# Patient Record
Sex: Female | Born: 1965 | Race: Black or African American | Hispanic: No | Marital: Single | Smoking: Never smoker
Health system: Southern US, Community
[De-identification: ages and names within clinical notes are randomized; demographics above are authoritative.]

## PROBLEM LIST (undated history)

## (undated) DIAGNOSIS — N1831 Chronic kidney disease, stage 3a: Secondary | ICD-10-CM

## (undated) DIAGNOSIS — E785 Hyperlipidemia, unspecified: Secondary | ICD-10-CM

## (undated) DIAGNOSIS — G4733 Obstructive sleep apnea (adult) (pediatric): Secondary | ICD-10-CM

## (undated) DIAGNOSIS — E1129 Type 2 diabetes mellitus with other diabetic kidney complication: Secondary | ICD-10-CM

## (undated) DIAGNOSIS — I10 Essential (primary) hypertension: Secondary | ICD-10-CM

## (undated) HISTORY — PX: TUBAL LIGATION: SHX77

---

## 2004-05-27 ENCOUNTER — Emergency Department: Payer: Self-pay | Admitting: Emergency Medicine

## 2010-11-17 ENCOUNTER — Emergency Department: Payer: Self-pay | Admitting: *Deleted

## 2012-12-18 ENCOUNTER — Emergency Department: Payer: Self-pay | Admitting: Emergency Medicine

## 2012-12-19 LAB — COMPREHENSIVE METABOLIC PANEL
Albumin: 3.8 g/dL (ref 3.4–5.0)
BUN: 12 mg/dL (ref 7–18)
Calcium, Total: 9.5 mg/dL (ref 8.5–10.1)
Chloride: 104 mmol/L (ref 98–107)
EGFR (African American): >60
Potassium: 3.8 mmol/L (ref 3.5–5.1)
SGOT(AST): 14 U/L — ABNORMAL LOW (ref 15–37)
SGPT (ALT): 24 U/L (ref 12–78)
Sodium: 137 mmol/L (ref 136–145)
Total Protein: 8.6 g/dL — ABNORMAL HIGH (ref 6.4–8.2)

## 2012-12-19 LAB — URINALYSIS, COMPLETE
Bacteria: NONE SEEN
Bilirubin,UR: NEGATIVE
Blood: NEGATIVE
Glucose,UR: NEGATIVE mg/dL (ref 0–75)
Ketone: NEGATIVE
Nitrite: NEGATIVE
Ph: 7 (ref 4.5–8.0)
Protein: NEGATIVE
RBC,UR: 1 /HPF (ref 0–5)
Specific Gravity: 1.01 (ref 1.003–1.030)
Squamous Epithelial: 2
WBC UR: 7 /HPF (ref 0–5)

## 2012-12-19 LAB — CBC
HGB: 13.9 g/dL (ref 12.0–16.0)
MCH: 27.6 pg (ref 26.0–34.0)
MCV: 81 fL (ref 80–100)
Platelet: 209 10*3/uL (ref 150–440)
RDW: 14.7 % — ABNORMAL HIGH (ref 11.5–14.5)
WBC: 6 10*3/uL (ref 3.6–11.0)

## 2012-12-19 LAB — TSH: Thyroid Stimulating Horm: 5.15 u[IU]/mL — ABNORMAL HIGH

## 2012-12-19 LAB — TROPONIN I: Troponin-I: <0.02 ng/mL

## 2014-02-19 ENCOUNTER — Emergency Department: Payer: Self-pay | Admitting: Emergency Medicine

## 2014-06-04 DIAGNOSIS — J4530 Mild persistent asthma, uncomplicated: Secondary | ICD-10-CM | POA: Insufficient documentation

## 2015-07-04 ENCOUNTER — Other Ambulatory Visit: Payer: Self-pay | Admitting: Orthopedic Surgery

## 2015-07-04 DIAGNOSIS — S46001D Unspecified injury of muscle(s) and tendon(s) of the rotator cuff of right shoulder, subsequent encounter: Secondary | ICD-10-CM

## 2015-07-23 ENCOUNTER — Ambulatory Visit: Payer: Self-pay

## 2016-08-10 ENCOUNTER — Other Ambulatory Visit: Payer: Self-pay | Admitting: General Practice

## 2016-08-10 ENCOUNTER — Ambulatory Visit
Admission: RE | Admit: 2016-08-10 | Discharge: 2016-08-10 | Disposition: A | Discharge status: Home / Self Care | Payer: Disability Insurance | Source: Ambulatory Visit | Attending: General Practice | Admitting: General Practice

## 2016-08-10 DIAGNOSIS — M170 Bilateral primary osteoarthritis of knee: Secondary | ICD-10-CM

## 2018-11-27 IMAGING — CR DG KNEE 1-2V*L*
1 series · 2 of 2 positions shown · non-contrast
Comparison: None.

CLINICAL DATA: Knee pain

EXAM:
LEFT KNEE - 2 VIEW

[Series 1: dg knee 1-2 views left · 0.14mm/px · 2 of 2 slices shown]
[im 1/2]
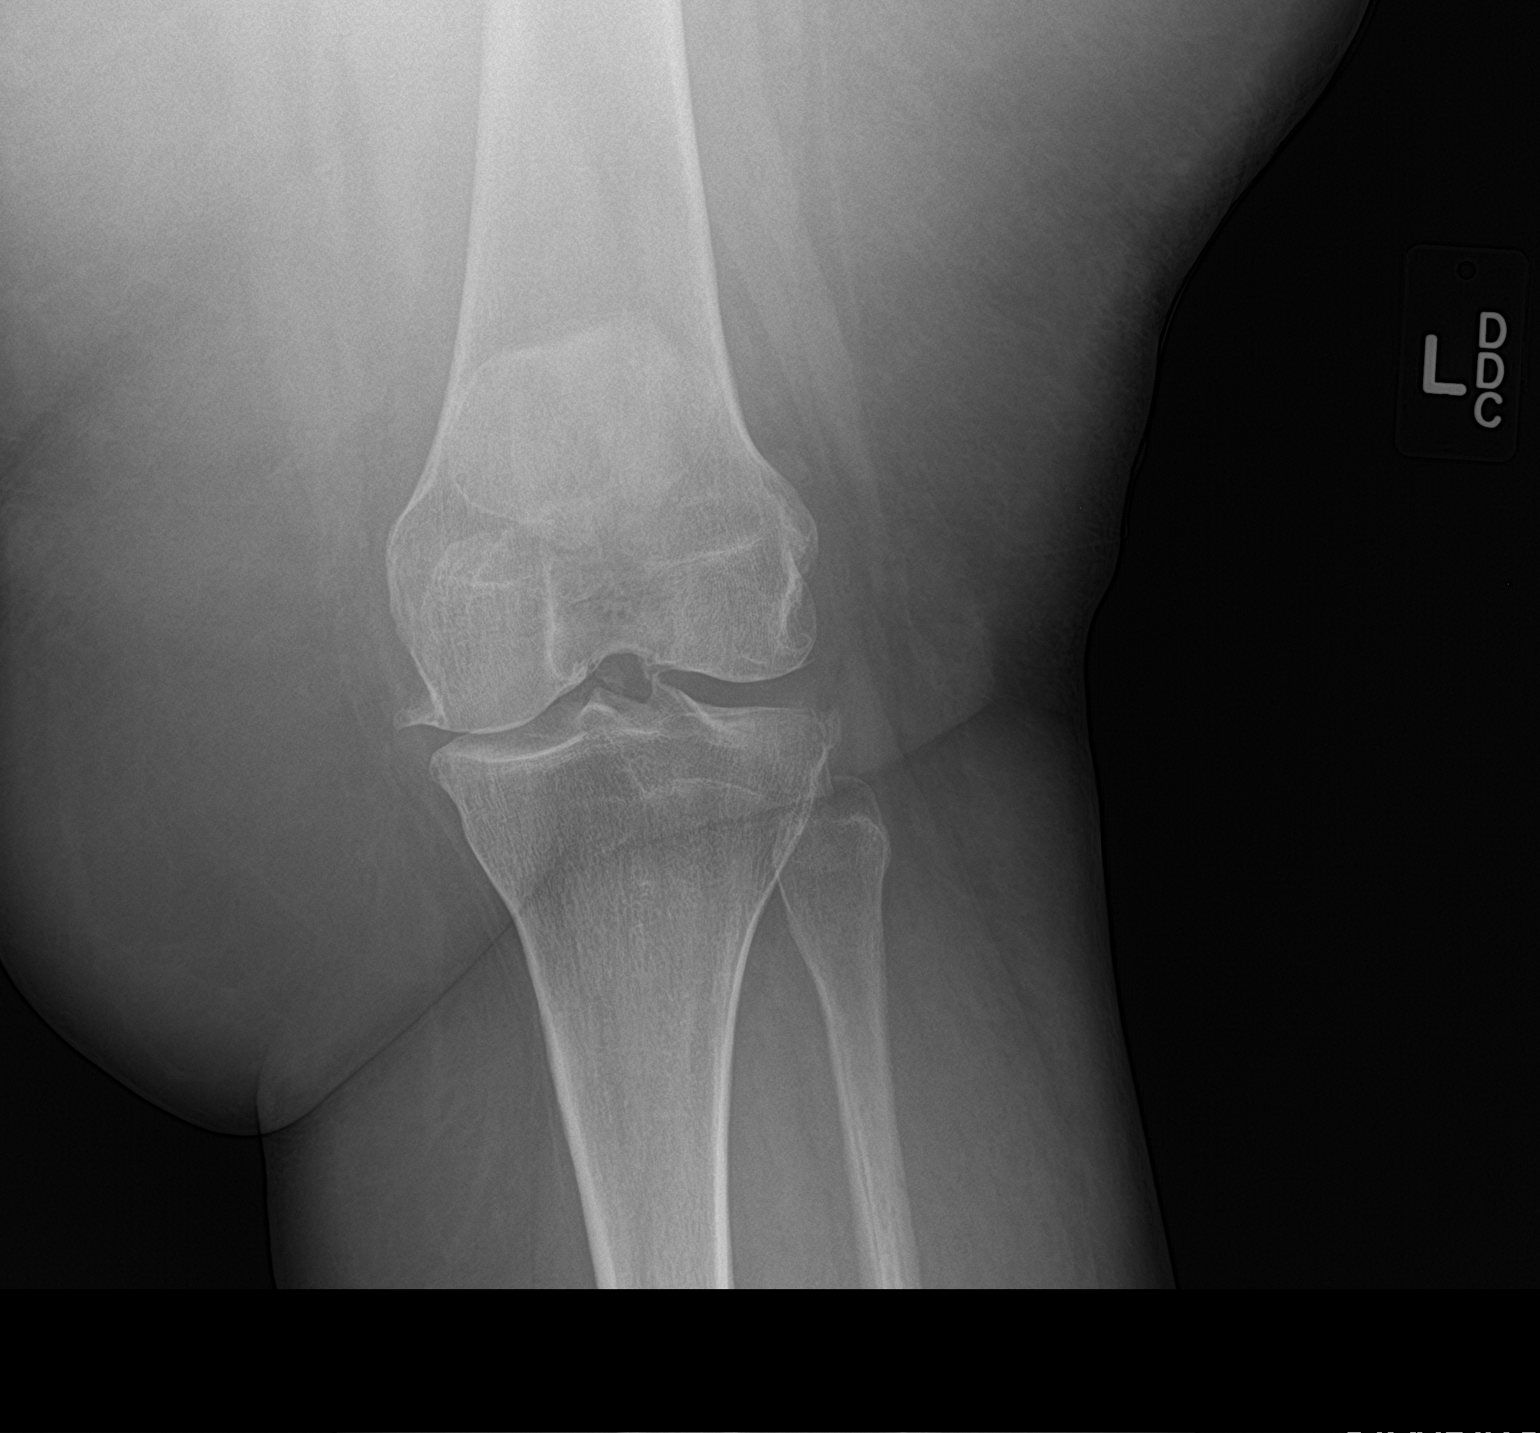
[im 2/2]
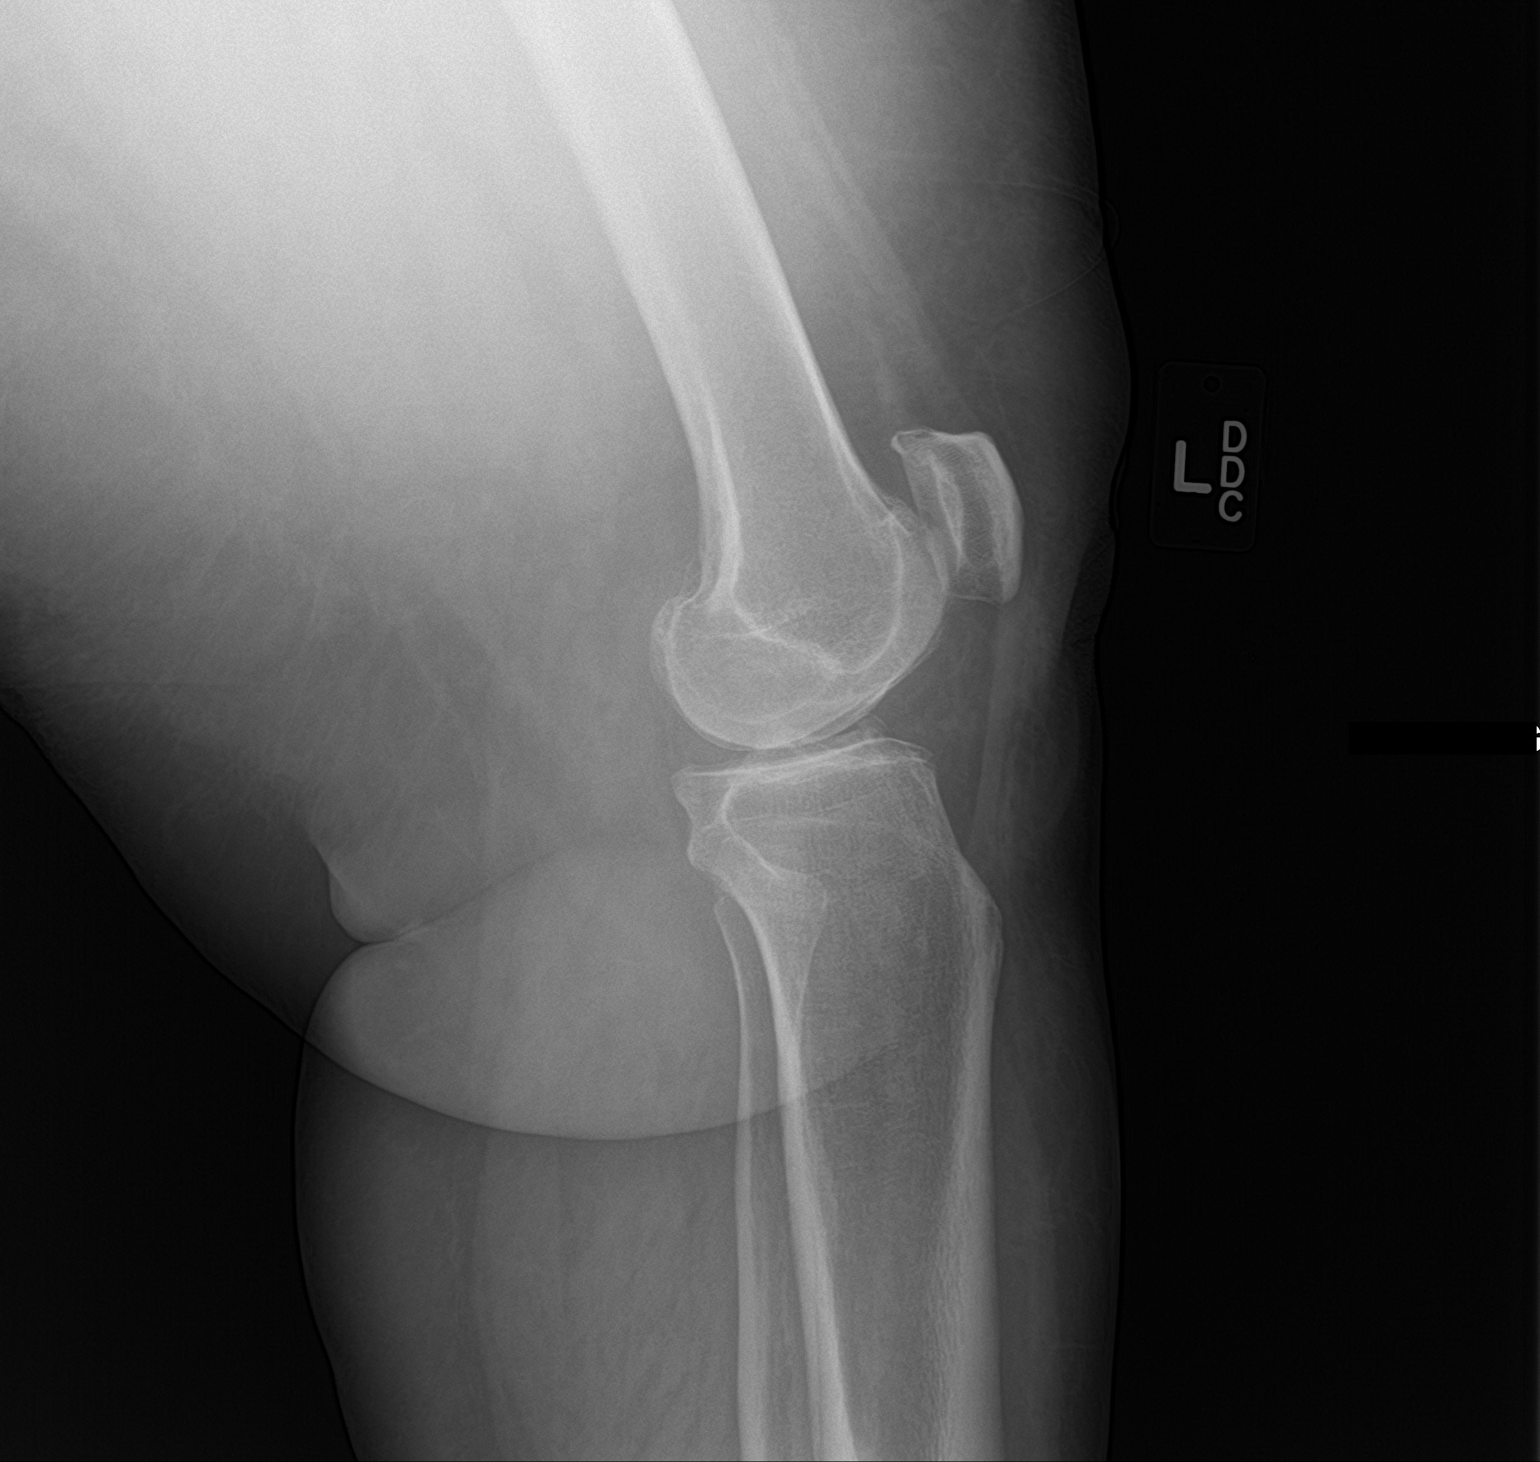

[2 of 2 positions shown; findings below may reference images not displayed]

FINDINGS: Degenerative changes are noted in all 3 joint compartments. No acute
fracture or dislocation is noted. No joint effusion is seen. No soft
tissue abnormality is noted.
IMPRESSION: Degenerative change without acute abnormality.

## 2019-09-12 ENCOUNTER — Other Ambulatory Visit: Payer: Self-pay | Admitting: Family Medicine

## 2019-09-12 DIAGNOSIS — M5416 Radiculopathy, lumbar region: Secondary | ICD-10-CM

## 2019-09-12 DIAGNOSIS — G8929 Other chronic pain: Secondary | ICD-10-CM

## 2019-10-17 ENCOUNTER — Ambulatory Visit: Payer: Disability Insurance | Admitting: Dietician

## 2020-03-04 ENCOUNTER — Telehealth: Payer: Self-pay | Admitting: Medical

## 2020-04-10 ENCOUNTER — Other Ambulatory Visit: Payer: Self-pay | Admitting: Family Medicine

## 2020-04-10 DIAGNOSIS — R748 Abnormal levels of other serum enzymes: Secondary | ICD-10-CM

## 2020-04-19 ENCOUNTER — Other Ambulatory Visit: Payer: Self-pay

## 2020-04-19 ENCOUNTER — Ambulatory Visit
Admission: RE | Admit: 2020-04-19 | Discharge: 2020-04-19 | Disposition: A | Discharge status: Home / Self Care | Payer: 59 | Source: Ambulatory Visit | Attending: Family Medicine | Admitting: Family Medicine

## 2020-04-19 DIAGNOSIS — R748 Abnormal levels of other serum enzymes: Secondary | ICD-10-CM | POA: Diagnosis present

## 2021-03-05 ENCOUNTER — Encounter: Payer: Self-pay | Admitting: Nurse Practitioner

## 2021-03-05 ENCOUNTER — Other Ambulatory Visit: Payer: Self-pay | Admitting: Nurse Practitioner

## 2021-03-05 ENCOUNTER — Other Ambulatory Visit: Payer: Self-pay

## 2021-03-05 VITALS — BP 119/71 | HR 83 | Temp 97.9°F | Resp 16

## 2021-03-05 DIAGNOSIS — J0110 Acute frontal sinusitis, unspecified: Secondary | ICD-10-CM

## 2021-03-05 MED ORDER — DOXYCYCLINE HYCLATE 100 MG PO TABS: 100.0000 mg | ORAL_TABLET | Freq: Two times a day (BID) | ORAL | 0 refills | Status: DC

## 2021-03-05 NOTE — Progress Notes (Signed)
Patient chart created in error.

## 2021-03-10 ENCOUNTER — Other Ambulatory Visit: Payer: Self-pay

## 2021-03-10 ENCOUNTER — Ambulatory Visit: Payer: 59 | Admitting: Medical

## 2022-06-23 ENCOUNTER — Other Ambulatory Visit: Payer: Self-pay

## 2022-06-23 ENCOUNTER — Encounter: Payer: Self-pay | Admitting: Internal Medicine

## 2022-06-23 ENCOUNTER — Inpatient Hospital Stay: Payer: 59

## 2022-06-23 ENCOUNTER — Inpatient Hospital Stay
Admission: EM | Admit: 2022-06-23 | Discharge: 2022-06-29 | DRG: 981 | Disposition: A | Discharge status: Home Health | Payer: 59 | Attending: Obstetrics and Gynecology | Admitting: Obstetrics and Gynecology

## 2022-06-23 DIAGNOSIS — T50995A Adverse effect of other drugs, medicaments and biological substances, initial encounter: Secondary | ICD-10-CM | POA: Diagnosis present

## 2022-06-23 DIAGNOSIS — Y92009 Unspecified place in unspecified non-institutional (private) residence as the place of occurrence of the external cause: Secondary | ICD-10-CM

## 2022-06-23 DIAGNOSIS — E1129 Type 2 diabetes mellitus with other diabetic kidney complication: Secondary | ICD-10-CM | POA: Diagnosis present

## 2022-06-23 DIAGNOSIS — Z791 Long term (current) use of non-steroidal anti-inflammatories (NSAID): Secondary | ICD-10-CM

## 2022-06-23 DIAGNOSIS — K853 Drug induced acute pancreatitis without necrosis or infection: Secondary | ICD-10-CM | POA: Diagnosis present

## 2022-06-23 DIAGNOSIS — J45909 Unspecified asthma, uncomplicated: Secondary | ICD-10-CM | POA: Diagnosis present

## 2022-06-23 DIAGNOSIS — R7989 Other specified abnormal findings of blood chemistry: Secondary | ICD-10-CM | POA: Diagnosis present

## 2022-06-23 DIAGNOSIS — N189 Chronic kidney disease, unspecified: Secondary | ICD-10-CM | POA: Diagnosis not present

## 2022-06-23 DIAGNOSIS — I428 Other cardiomyopathies: Secondary | ICD-10-CM | POA: Diagnosis not present

## 2022-06-23 DIAGNOSIS — R339 Retention of urine, unspecified: Secondary | ICD-10-CM | POA: Diagnosis present

## 2022-06-23 DIAGNOSIS — B3731 Acute candidiasis of vulva and vagina: Secondary | ICD-10-CM | POA: Diagnosis present

## 2022-06-23 DIAGNOSIS — E860 Dehydration: Secondary | ICD-10-CM | POA: Diagnosis present

## 2022-06-23 DIAGNOSIS — R109 Unspecified abdominal pain: Secondary | ICD-10-CM | POA: Diagnosis present

## 2022-06-23 DIAGNOSIS — G4733 Obstructive sleep apnea (adult) (pediatric): Secondary | ICD-10-CM

## 2022-06-23 DIAGNOSIS — E1311 Other specified diabetes mellitus with ketoacidosis with coma: Principal | ICD-10-CM

## 2022-06-23 DIAGNOSIS — N179 Acute kidney failure, unspecified: Secondary | ICD-10-CM | POA: Diagnosis not present

## 2022-06-23 DIAGNOSIS — Z833 Family history of diabetes mellitus: Secondary | ICD-10-CM

## 2022-06-23 DIAGNOSIS — N1831 Chronic kidney disease, stage 3a: Secondary | ICD-10-CM | POA: Diagnosis present

## 2022-06-23 DIAGNOSIS — Z885 Allergy status to narcotic agent status: Secondary | ICD-10-CM

## 2022-06-23 DIAGNOSIS — E1011 Type 1 diabetes mellitus with ketoacidosis with coma: Secondary | ICD-10-CM

## 2022-06-23 DIAGNOSIS — D696 Thrombocytopenia, unspecified: Secondary | ICD-10-CM | POA: Diagnosis present

## 2022-06-23 DIAGNOSIS — E785 Hyperlipidemia, unspecified: Secondary | ICD-10-CM | POA: Diagnosis present

## 2022-06-23 DIAGNOSIS — I10 Essential (primary) hypertension: Secondary | ICD-10-CM | POA: Diagnosis not present

## 2022-06-23 DIAGNOSIS — M79671 Pain in right foot: Secondary | ICD-10-CM | POA: Diagnosis present

## 2022-06-23 DIAGNOSIS — G9341 Metabolic encephalopathy: Secondary | ICD-10-CM | POA: Diagnosis not present

## 2022-06-23 DIAGNOSIS — F419 Anxiety disorder, unspecified: Secondary | ICD-10-CM | POA: Diagnosis present

## 2022-06-23 DIAGNOSIS — L299 Pruritus, unspecified: Secondary | ICD-10-CM | POA: Diagnosis present

## 2022-06-23 DIAGNOSIS — I129 Hypertensive chronic kidney disease with stage 1 through stage 4 chronic kidney disease, or unspecified chronic kidney disease: Secondary | ICD-10-CM | POA: Diagnosis present

## 2022-06-23 DIAGNOSIS — E1122 Type 2 diabetes mellitus with diabetic chronic kidney disease: Secondary | ICD-10-CM | POA: Diagnosis present

## 2022-06-23 DIAGNOSIS — R001 Bradycardia, unspecified: Secondary | ICD-10-CM | POA: Diagnosis present

## 2022-06-23 DIAGNOSIS — Z6841 Body Mass Index (BMI) 40.0 and over, adult: Secondary | ICD-10-CM | POA: Diagnosis not present

## 2022-06-23 DIAGNOSIS — E1110 Type 2 diabetes mellitus with ketoacidosis without coma: Principal | ICD-10-CM | POA: Diagnosis present

## 2022-06-23 DIAGNOSIS — Z7985 Long-term (current) use of injectable non-insulin antidiabetic drugs: Secondary | ICD-10-CM

## 2022-06-23 DIAGNOSIS — E870 Hyperosmolality and hypernatremia: Secondary | ICD-10-CM | POA: Diagnosis present

## 2022-06-23 DIAGNOSIS — Z79899 Other long term (current) drug therapy: Secondary | ICD-10-CM

## 2022-06-23 DIAGNOSIS — F32A Depression, unspecified: Secondary | ICD-10-CM | POA: Diagnosis present

## 2022-06-23 DIAGNOSIS — K5900 Constipation, unspecified: Secondary | ICD-10-CM | POA: Diagnosis present

## 2022-06-23 HISTORY — DX: Obstructive sleep apnea (adult) (pediatric): G47.33

## 2022-06-23 HISTORY — DX: Essential (primary) hypertension: I10

## 2022-06-23 HISTORY — DX: Chronic kidney disease, stage 3a: N18.31

## 2022-06-23 HISTORY — DX: Hyperlipidemia, unspecified: E78.5

## 2022-06-23 HISTORY — DX: Morbid (severe) obesity due to excess calories: E66.01

## 2022-06-23 HISTORY — DX: Type 2 diabetes mellitus with other diabetic kidney complication: E11.29

## 2022-06-23 LAB — BASIC METABOLIC PANEL
Anion gap: 26 — ABNORMAL HIGH (ref 5–15)
Anion gap: 14 (ref 5–15)
Anion gap: 10 (ref 5–15)
Anion gap: 7 (ref 5–15)
BUN: 57 mg/dL — ABNORMAL HIGH (ref 6–20)
BUN: 53 mg/dL — ABNORMAL HIGH (ref 6–20)
BUN: 54 mg/dL — ABNORMAL HIGH (ref 6–20)
BUN: 55 mg/dL — ABNORMAL HIGH (ref 6–20)
CO2: 10 mmol/L — ABNORMAL LOW (ref 22–32)
CO2: 22 mmol/L (ref 22–32)
CO2: 27 mmol/L (ref 22–32)
CO2: 28 mmol/L (ref 22–32)
Calcium: 9.7 mg/dL (ref 8.9–10.3)
Calcium: 9.9 mg/dL (ref 8.9–10.3)
Calcium: 9.6 mg/dL (ref 8.9–10.3)
Calcium: 9.5 mg/dL (ref 8.9–10.3)
Chloride: 114 mmol/L — ABNORMAL HIGH (ref 98–111)
Chloride: 121 mmol/L — ABNORMAL HIGH (ref 98–111)
Chloride: 122 mmol/L — ABNORMAL HIGH (ref 98–111)
Chloride: 124 mmol/L — ABNORMAL HIGH (ref 98–111)
Creatinine, Ser: 2.11 mg/dL — ABNORMAL HIGH (ref 0.44–1.00)
Creatinine, Ser: 1.76 mg/dL — ABNORMAL HIGH (ref 0.44–1.00)
Creatinine, Ser: 1.64 mg/dL — ABNORMAL HIGH (ref 0.44–1.00)
Creatinine, Ser: 1.67 mg/dL — ABNORMAL HIGH (ref 0.44–1.00)
GFR, Estimated: 27 mL/min — ABNORMAL LOW (ref >60)
GFR, Estimated: 34 mL/min — ABNORMAL LOW (ref >60)
GFR, Estimated: 37 mL/min — ABNORMAL LOW (ref >60)
GFR, Estimated: 36 mL/min — ABNORMAL LOW (ref >60)
Glucose, Bld: 676 mg/dL (ref 70–99)
Glucose, Bld: 345 mg/dL — ABNORMAL HIGH (ref 70–99)
Glucose, Bld: 257 mg/dL — ABNORMAL HIGH (ref 70–99)
Glucose, Bld: 211 mg/dL — ABNORMAL HIGH (ref 70–99)
Potassium: 4.5 mmol/L (ref 3.5–5.1)
Potassium: 3.6 mmol/L (ref 3.5–5.1)
Potassium: 3.5 mmol/L (ref 3.5–5.1)
Potassium: 3.7 mmol/L (ref 3.5–5.1)
Sodium: 150 mmol/L — ABNORMAL HIGH (ref 135–145)
Sodium: 157 mmol/L — ABNORMAL HIGH (ref 135–145)
Sodium: 158 mmol/L — ABNORMAL HIGH (ref 135–145)
Sodium: 159 mmol/L — ABNORMAL HIGH (ref 135–145)

## 2022-06-23 LAB — URINALYSIS, COMPLETE (UACMP) WITH MICROSCOPIC
Bilirubin Urine: NEGATIVE
Glucose, UA: >=500 mg/dL — AB
Hgb urine dipstick: NEGATIVE
Ketones, ur: 20 mg/dL — AB
Leukocytes,Ua: NEGATIVE
Nitrite: NEGATIVE
Protein, ur: NEGATIVE mg/dL
Specific Gravity, Urine: 1.029 (ref 1.005–1.030)
pH: 5 (ref 5.0–8.0)

## 2022-06-23 LAB — GLUCOSE, CAPILLARY
Glucose-Capillary: 177 mg/dL — ABNORMAL HIGH (ref 70–99)
Glucose-Capillary: 178 mg/dL — ABNORMAL HIGH (ref 70–99)
Glucose-Capillary: 184 mg/dL — ABNORMAL HIGH (ref 70–99)
Glucose-Capillary: 188 mg/dL — ABNORMAL HIGH (ref 70–99)
Glucose-Capillary: 223 mg/dL — ABNORMAL HIGH (ref 70–99)
Glucose-Capillary: 225 mg/dL — ABNORMAL HIGH (ref 70–99)
Glucose-Capillary: 259 mg/dL — ABNORMAL HIGH (ref 70–99)
Glucose-Capillary: 274 mg/dL — ABNORMAL HIGH (ref 70–99)
Glucose-Capillary: 365 mg/dL — ABNORMAL HIGH (ref 70–99)
Glucose-Capillary: 438 mg/dL — ABNORMAL HIGH (ref 70–99)
Glucose-Capillary: 468 mg/dL — ABNORMAL HIGH (ref 70–99)
Glucose-Capillary: 472 mg/dL — ABNORMAL HIGH (ref 70–99)

## 2022-06-23 LAB — URINE DRUG SCREEN, QUALITATIVE (ARMC ONLY)
Amphetamines, Ur Screen: NOT DETECTED
Barbiturates, Ur Screen: NOT DETECTED
Benzodiazepine, Ur Scrn: NOT DETECTED
Cannabinoid 50 Ng, Ur ~~LOC~~: NOT DETECTED
Cocaine Metabolite,Ur ~~LOC~~: NOT DETECTED
MDMA (Ecstasy)Ur Screen: NOT DETECTED
Methadone Scn, Ur: NOT DETECTED
Opiate, Ur Screen: NOT DETECTED
Phencyclidine (PCP) Ur S: NOT DETECTED

## 2022-06-23 LAB — BLOOD GAS, VENOUS
Acid-base deficit: 11.5 mmol/L — ABNORMAL HIGH (ref 0.0–2.0)
Acid-base deficit: 2.5 mmol/L — ABNORMAL HIGH (ref 0.0–2.0)
Bicarbonate: 11.2 mmol/L — ABNORMAL LOW (ref 20.0–28.0)
Bicarbonate: 21.7 mmol/L (ref 20.0–28.0)
O2 Saturation: 93.2 %
O2 Saturation: 88.6 %
Patient temperature: 37
Patient temperature: 37
pCO2, Ven: 19 mmHg — CL (ref 44–60)
pCO2, Ven: 35 mmHg — ABNORMAL LOW (ref 44–60)
pH, Ven: 7.38 (ref 7.25–7.43)
pH, Ven: 7.4 (ref 7.25–7.43)
pO2, Ven: 68 mmHg — ABNORMAL HIGH (ref 32–45)
pO2, Ven: 60 mmHg — ABNORMAL HIGH (ref 32–45)

## 2022-06-23 LAB — CBC
HCT: 49.7 % — ABNORMAL HIGH (ref 36.0–46.0)
Hemoglobin: 15.5 g/dL — ABNORMAL HIGH (ref 12.0–15.0)
MCH: 26.9 pg (ref 26.0–34.0)
MCHC: 31.2 g/dL (ref 30.0–36.0)
MCV: 86.3 fL (ref 80.0–100.0)
Platelets: 246 10*3/uL (ref 150–400)
RBC: 5.76 MIL/uL — ABNORMAL HIGH (ref 3.87–5.11)
RDW: 14.5 % (ref 11.5–15.5)
WBC: 10.1 10*3/uL (ref 4.0–10.5)
nRBC: 0 % (ref 0.0–0.2)

## 2022-06-23 LAB — MRSA NEXT GEN BY PCR, NASAL: MRSA by PCR Next Gen: NOT DETECTED

## 2022-06-23 LAB — COMPREHENSIVE METABOLIC PANEL
ALT: 21 U/L (ref 0–44)
AST: 20 U/L (ref 15–41)
Albumin: 4.3 g/dL (ref 3.5–5.0)
Alkaline Phosphatase: 126 U/L (ref 38–126)
Anion gap: 27 — ABNORMAL HIGH (ref 5–15)
BUN: 56 mg/dL — ABNORMAL HIGH (ref 6–20)
CO2: 12 mmol/L — ABNORMAL LOW (ref 22–32)
Calcium: 10.1 mg/dL (ref 8.9–10.3)
Chloride: 109 mmol/L (ref 98–111)
Creatinine, Ser: 2.24 mg/dL — ABNORMAL HIGH (ref 0.44–1.00)
GFR, Estimated: 25 mL/min — ABNORMAL LOW (ref >60)
Glucose, Bld: 794 mg/dL (ref 70–99)
Potassium: 5.1 mmol/L (ref 3.5–5.1)
Sodium: 148 mmol/L — ABNORMAL HIGH (ref 135–145)
Total Bilirubin: 2 mg/dL — ABNORMAL HIGH (ref 0.3–1.2)
Total Protein: 8.9 g/dL — ABNORMAL HIGH (ref 6.5–8.1)

## 2022-06-23 LAB — TRIGLYCERIDES: Triglycerides: 259 mg/dL — ABNORMAL HIGH (ref <150)

## 2022-06-23 LAB — LACTIC ACID, PLASMA
Lactic Acid, Venous: 4.4 mmol/L (ref 0.5–1.9)
Lactic Acid, Venous: 3.5 mmol/L (ref 0.5–1.9)
Lactic Acid, Venous: 2.1 mmol/L (ref 0.5–1.9)
Lactic Acid, Venous: 1.8 mmol/L (ref 0.5–1.9)

## 2022-06-23 LAB — CBG MONITORING, ED: Glucose-Capillary: 568 mg/dL (ref 70–99)

## 2022-06-23 LAB — PHOSPHORUS: Phosphorus: 2.1 mg/dL — ABNORMAL LOW (ref 2.5–4.6)

## 2022-06-23 LAB — TROPONIN I (HIGH SENSITIVITY): Troponin I (High Sensitivity): 23 ng/L — ABNORMAL HIGH (ref <18)

## 2022-06-23 LAB — PROCALCITONIN: Procalcitonin: <0.1 ng/mL

## 2022-06-23 LAB — LIPASE, BLOOD: Lipase: 101 U/L — ABNORMAL HIGH (ref 11–51)

## 2022-06-23 LAB — BETA-HYDROXYBUTYRIC ACID: Beta-Hydroxybutyric Acid: >8 mmol/L — ABNORMAL HIGH (ref 0.05–0.27)

## 2022-06-23 LAB — MAGNESIUM: Magnesium: 3.3 mg/dL — ABNORMAL HIGH (ref 1.7–2.4)

## 2022-06-23 MED ORDER — HALOPERIDOL LACTATE 5 MG/ML IJ SOLN
INTRAMUSCULAR | Status: AC
  Filled 2022-06-23: qty 1

## 2022-06-23 MED ORDER — DEXTROSE IN LACTATED RINGERS 5 % IV SOLN: INTRAVENOUS | Status: DC

## 2022-06-23 MED ORDER — MONTELUKAST SODIUM 10 MG PO TABS: 10.0000 mg | ORAL_TABLET | Freq: Every day | ORAL | Status: DC

## 2022-06-23 MED ORDER — DEXTROSE IN LACTATED RINGERS 5 % IV SOLN
INTRAVENOUS | Status: DC
Start: 2022-06-23 — End: 2022-06-23

## 2022-06-23 MED ORDER — PANTOPRAZOLE SODIUM 40 MG PO TBEC: 40.0000 mg | DELAYED_RELEASE_TABLET | Freq: Every day | ORAL | Status: DC

## 2022-06-23 MED ORDER — CHLORHEXIDINE GLUCONATE CLOTH 2 % EX PADS
6.0000 | MEDICATED_PAD | Freq: Every day | CUTANEOUS | Status: DC
  Administered 2022-06-23 – 2022-06-28 (×5): 6 via TOPICAL

## 2022-06-23 MED ORDER — INSULIN REGULAR(HUMAN) IN NACL 100-0.9 UT/100ML-% IV SOLN
INTRAVENOUS | Status: DC
  Administered 2022-06-23: 5.5 [IU]/h via INTRAVENOUS
  Administered 2022-06-23: 11.5 [IU]/h via INTRAVENOUS
  Filled 2022-06-23 (×2): qty 100

## 2022-06-23 MED ORDER — HALOPERIDOL LACTATE 5 MG/ML IJ SOLN
4.0000 mg | Freq: Once | INTRAMUSCULAR | Status: AC
  Administered 2022-06-23: 4 mg via INTRAVENOUS

## 2022-06-23 MED ORDER — FENTANYL CITRATE PF 50 MCG/ML IJ SOSY: 25.0000 ug | PREFILLED_SYRINGE | INTRAMUSCULAR | Status: DC | PRN

## 2022-06-23 MED ORDER — HYDRALAZINE HCL 50 MG PO TABS: 100.0000 mg | ORAL_TABLET | Freq: Three times a day (TID) | ORAL | Status: DC

## 2022-06-23 MED ORDER — ATORVASTATIN CALCIUM 20 MG PO TABS: 20.0000 mg | ORAL_TABLET | Freq: Every day | ORAL | Status: DC

## 2022-06-23 MED ORDER — FENTANYL CITRATE PF 50 MCG/ML IJ SOSY
25.0000 ug | PREFILLED_SYRINGE | INTRAMUSCULAR | Status: DC | PRN
  Administered 2022-06-23 – 2022-06-25 (×9): 50 ug via INTRAVENOUS
  Filled 2022-06-23 (×9): qty 1

## 2022-06-23 MED ORDER — ACETAMINOPHEN 325 MG PO TABS: 650.0000 mg | ORAL_TABLET | Freq: Four times a day (QID) | ORAL | Status: DC | PRN

## 2022-06-23 MED ORDER — SODIUM BICARBONATE 8.4 % IV SOLN: 150.0000 meq | Freq: Once | INTRAVENOUS | Status: DC

## 2022-06-23 MED ORDER — MORPHINE SULFATE (PF) 2 MG/ML IV SOLN
2.0000 mg | INTRAVENOUS | Status: DC | PRN
  Administered 2022-06-23: 2 mg via INTRAVENOUS
  Filled 2022-06-23: qty 1

## 2022-06-23 MED ORDER — MEGESTROL ACETATE 20 MG PO TABS
40.0000 mg | ORAL_TABLET | Freq: Every day | ORAL | Status: DC
  Filled 2022-06-23 (×2): qty 2

## 2022-06-23 MED ORDER — DEXMEDETOMIDINE HCL IN NACL 400 MCG/100ML IV SOLN
0.0000 ug/kg/h | INTRAVENOUS | Status: DC
  Administered 2022-06-23: 0.7 ug/kg/h via INTRAVENOUS
  Administered 2022-06-23: 0.4 ug/kg/h via INTRAVENOUS
  Filled 2022-06-23: qty 100

## 2022-06-23 MED ORDER — DEXTROSE 50 % IV SOLN
0.0000 mL | INTRAVENOUS | Status: DC | PRN
Start: 2022-06-23 — End: 2022-06-23

## 2022-06-23 MED ORDER — DEXTROSE 50 % IV SOLN: 0.0000 mL | INTRAVENOUS | Status: DC | PRN

## 2022-06-23 MED ORDER — ENOXAPARIN SODIUM 60 MG/0.6ML IJ SOSY
0.5000 mg/kg | PREFILLED_SYRINGE | INTRAMUSCULAR | Status: DC
  Administered 2022-06-23 – 2022-06-28 (×6): 60 mg via SUBCUTANEOUS
  Filled 2022-06-23 (×6): qty 0.6

## 2022-06-23 MED ORDER — LACTATED RINGERS IV BOLUS
1000.0000 mL | Freq: Once | INTRAVENOUS | Status: AC
  Administered 2022-06-23: 1000 mL via INTRAVENOUS

## 2022-06-23 MED ORDER — LACTATED RINGERS IV SOLN: INTRAVENOUS | Status: DC

## 2022-06-23 MED ORDER — HYDRALAZINE HCL 20 MG/ML IJ SOLN: 5.0000 mg | INTRAMUSCULAR | Status: DC | PRN

## 2022-06-23 MED ORDER — SODIUM BICARBONATE 8.4 % IV SOLN
100.0000 meq | Freq: Once | INTRAVENOUS | Status: AC
  Administered 2022-06-23: 100 meq via INTRAVENOUS
  Filled 2022-06-23: qty 100

## 2022-06-23 MED ORDER — MIRABEGRON ER 25 MG PO TB24
25.0000 mg | ORAL_TABLET | Freq: Every day | ORAL | Status: DC
  Filled 2022-06-23 (×2): qty 1

## 2022-06-23 MED ORDER — ONDANSETRON HCL 4 MG/2ML IJ SOLN
4.0000 mg | Freq: Three times a day (TID) | INTRAMUSCULAR | Status: DC | PRN
  Administered 2022-06-23 – 2022-06-25 (×3): 4 mg via INTRAVENOUS
  Filled 2022-06-23 (×3): qty 2

## 2022-06-23 MED ORDER — CLONIDINE HCL 0.1 MG PO TABS: 0.3000 mg | ORAL_TABLET | Freq: Two times a day (BID) | ORAL | Status: DC

## 2022-06-23 MED ORDER — LACTATED RINGERS IV SOLN
INTRAVENOUS | Status: DC
Start: 2022-06-23 — End: 2022-06-23

## 2022-06-23 MED ORDER — INSULIN REGULAR(HUMAN) IN NACL 100-0.9 UT/100ML-% IV SOLN: INTRAVENOUS | Status: DC

## 2022-06-23 MED ORDER — DEXMEDETOMIDINE HCL IN NACL 400 MCG/100ML IV SOLN
INTRAVENOUS | Status: AC
  Filled 2022-06-23: qty 100

## 2022-06-23 MED ORDER — ACETAMINOPHEN 650 MG RE SUPP: 650.0000 mg | Freq: Four times a day (QID) | RECTAL | Status: DC | PRN

## 2022-06-23 MED ORDER — PANTOPRAZOLE SODIUM 40 MG IV SOLR
40.0000 mg | INTRAVENOUS | Status: DC
  Administered 2022-06-23: 40 mg via INTRAVENOUS
  Filled 2022-06-23: qty 10

## 2022-06-23 NOTE — ED Notes (Signed)
Pt called out to use restroom. Pt barely able to keep eyes open or answer all questions. Pt states she does not remember anything or know why she is here. RN placed a purewick on patient due to risk of pt falling because pt still altered.

## 2022-06-23 NOTE — ED Notes (Signed)
RN took pt to CT prior to ICU transport. Pt became combative and attempted to get off CT table multiple times. Pt not remaining still and continuing to scream. RN and CT tech Harrison transported pt to ICU without obtaining CT due to combativeness and anxiety. ICU RN at bedside.

## 2022-06-23 NOTE — ED Triage Notes (Signed)
Pt arrived via EMS from home after her coworkers called due to patient not showing up to work. EMS states upon arrival pt had rapid breathing, tachycardia, screaming, not responding to questions but her blood sugar was 543. EMS states they found metformin, ozempic, high BP meds and anxiety/depression meds. Pt wears CPAP at night but was not wearing it on EMS arrival. Pt received NS bolus in route. Per EMS pt just recently lost her father as well. Pt not answering questions but screaming and rapidly breathing.   EMS states nurse manager from work is who they got in contact withMaralyn Sago [phone].

## 2022-06-23 NOTE — Progress Notes (Addendum)
Bradycardia Patient tolerating Precedex drip until RN placed outpatient CPAP on patient for OSA. HR acutely dropped from 60's into the 30's and BP dropped acutely into the 50's/30's. Patient lethargic due to sedatives. CPAP was removed and Precedex turned off and HR & BP quickly rebounded to normal limits - Due to acute nature of symptomatic bradycardia, resolving acutely only when CPAP removed, will order f/u Echo - f/u VBG  - CPAP off for at least the next hour (to give pt time to clear precedex) > then can retry > ETCO2 monitoring with [place] until then - consider PRN medication if patient becomes agitated again  Acute Pancreatitis  CT abdomen/pelvis 06/23/22:  Acute pancreatitis. Pancreatic parenchymal viability is not well assessed on this noncontrast examination. No peripancreatic fluid collections identified - Trend pancreatic enzymes & hepatic function - dka protocol in place - Strict I/O's: alert provider if UOP < 0.5 mL/kg/hr - Daily BMP, replace electrolytes PRN - Daily CBC, monitor hemoconcentration & WBC/fever curve, f/u cultures - trend lactic, PCT, CRP - monitor for signs of infection > consider antibiotic coverage depending on PCT/WBC/fever curve  - Pain management: Fentanyl PRN  - NPO, will need post-pyloric TF beginning as early as tomorrow if possible - hold ACE meds  Hypernatremia- worsening - place post-pyloric dubhoff and initiate free water flushes - continue D5 LR infusion Unable to obtain post-pyloric PO access. Discussed with Rx & TRH coverage. Free water deficit 7.7 L, will attempt to correct at 0.4 meq/L/h with D5W at 150 mL/h - will keep insulin drip overnight. Serial sodiums ordered  Discussed plan of care with [facility] NP who is in agreement.    Cheryll Cockayne Rust-Chester, AGACNP-BC Acute Care Nurse Practitioner [facility]   [phone] / [phone] Please see Amion for pager details.

## 2022-06-23 NOTE — H&P (Signed)
History and Physical    Cynthia Ayers ZOX:[id number] DOB: 1966-01-29 DOA: 06/23/2022  Referring MD/NP/PA:   PCP: Alm Bustard, NP   Patient coming from:  The patient is coming from home.   Chief Complaint: AMS  HPI: Cynthia Ayers is a 57 y.o. female with medical history significant of DM, HTN, HLD, morbid obesity, OSA on CPAP, depression with anxiety, CKD 3A, who presents with altered mental status.  Patient has AMS, can only provide limited medical history.  I tried to call her daughter without success, her daughter's cell phone is not set up for leaving a message. Therefore, most of the history is obtained by discussing the case with ED physician, per EMS report, and with the nursing staff.   Per report, pts coworkers called EMS when the pt did not show up to work today.  When EMS arrived at pts home she was found in bed confused.They checked a blood sugar which was greater than 500. Per EDP, initially patient could not answer questions or follow commands.  Patient was given IV fluid in ED, and started on insulin drip.  Her mental status improved slightly.  When I saw patient in ED, patient is confused about year 2024.  She knows her own name, knows that she is in the hospital.  She denies chest pain.  No active respiratory distress or cough noted.  She complains of abdominal pain, but cannot provide detailed information.  No active nausea, vomiting or diarrhea noted.  She moves all extremities. Later on, after pt is transferred to SDU, she becomes very agitated, not following command.  Data reviewed independently and ED Course: pt was found to have DKA (blood sugar 794, bicarbonate 12, anion gap 27, pH 7.38 by VBG, beta hydroxybutyric acid> 8.0, ketones 20), WBC 10.1, temperature normal, blood pressure 136/74, heart rate 110-120, RR 26, oxygen saturation 99% on room air.  Lipase 101, AST/ALT normal, total bilirubin 2.0.  Patient is admitted to stepdown as inpatient.  Dr. Belia Heman of ICU is  consulted.  EKG: I have personally reviewed.  Sinus rhythm, QTc 499, LAD, poor R wave progression, low voltage, heart rate 117.   Review of Systems: Could not review due to altered mental status.   Allergy: No Known Allergies  Past Medical History:  Diagnosis Date   Chronic renal failure, stage 3a    HLD (hyperlipidemia)    HTN (hypertension)    Morbid obesity with BMI of 45.0-49.9, adult    OSA on CPAP    Type II diabetes mellitus with renal manifestations     Past Surgical History:  Procedure Laterality Date   TUBAL LIGATION      Social History:  reports that she has never smoked. She has never been exposed to tobacco smoke. She has never used smokeless tobacco. She reports that she does not currently use alcohol. She reports that she does not use drugs.  Family History:  Family History  Problem Relation Age of Onset   Diabetes Sister      Prior to Admission medications   Medication Sig Start Date End Date Taking? Authorizing Provider  Armodafinil 150 MG tablet Take 150 mg by mouth daily. 05/15/22  Yes [provider]  Armodafinil 250 MG tablet Take 250 mg by mouth daily. 05/29/22  Yes [provider]  atorvastatin (LIPITOR) 20 MG tablet Take 20 mg by mouth at bedtime. 05/06/22 05/06/23 Yes [provider]  cloNIDine (CATAPRES) 0.3 MG tablet Take 0.3 mg by mouth 2 (two) times  daily. 04/28/22 04/28/23 Yes [provider]  fluconazole (DIFLUCAN) 100 MG tablet Take 100 mg by mouth daily. 06/15/22  Yes [provider]  hydrALAZINE (APRESOLINE) 100 MG tablet Take 1 tablet by mouth 3 (three) times daily. 12/24/21  Yes [provider]  ketorolac (TORADOL) 10 MG tablet Take 10 mg by mouth every 6 (six) hours as needed. 03/20/22  Yes [provider]  magnesium oxide (MAG-OX) 400 (240 Mg) MG tablet Take 1 tablet by mouth 2 (two) times daily. 05/17/22  Yes [provider]  medroxyPROGESTERone (PROVERA) 10 MG tablet Take by  mouth. 04/07/22  Yes [provider]  megestrol (MEGACE) 40 MG tablet Take by mouth. 04/28/22 04/28/23 Yes [provider]  meloxicam (MOBIC) 15 MG tablet Take by mouth. 12/09/21 12/09/22 Yes [provider]  mirabegron ER (MYRBETRIQ) 25 MG TB24 tablet Take 1 tablet by mouth daily. 06/18/22  Yes [provider]  montelukast (SINGULAIR) 10 MG tablet Take 1 tablet by mouth at bedtime. 04/28/22  Yes [provider]  MOUNJARO 10 MG/0.5ML Pen Inject into the skin. 03/02/22  Yes [provider]  ofloxacin (FLOXIN) 0.3 % OTIC solution SMARTSIG:5 Drop(s) Right Ear Twice Daily 05/18/22  Yes [provider]  pantoprazole (PROTONIX) 40 MG tablet Take by mouth. 04/28/22 04/28/23 Yes [provider]  potassium chloride (KLOR-CON M) 10 MEQ tablet Take by mouth. 04/28/22  Yes [provider]  predniSONE (DELTASONE) 10 MG tablet 3 tabs daily for 3 days, 2 tab daily for 3 days, 1 tab for 3 day 06/15/22  Yes [provider]  Semaglutide, 2 MG/DOSE, 8 MG/3ML SOPN Inject into the skin. 03/19/22  Yes [provider]  spironolactone (ALDACTONE) 50 MG tablet Take 1 tablet by mouth daily. 04/28/22 04/28/23 Yes [provider]  traMADol Janean Sark) 50 MG tablet Take by mouth. 05/22/22  Yes [provider]    Physical Exam: Vitals:   06/23/22 1415 06/23/22 1600 06/23/22 1630 06/23/22 1800  BP: (!) 144/91 97/64 96/66  97/62  Pulse: (!) 135 (!) 121 (!) 101 85  Resp: (!) 28 19  16   Temp: 98.1 F (36.7 C)     TempSrc: Axillary     SpO2: 100% 95% 95% 96%  Weight: 113.9 kg     Height: 5\' 3"  (1.6 m)      General: Dry mucous membrane HEENT:       Eyes: PERRL, EOMI, no scleral icterus.       ENT: No discharge from the ears and nose       Neck: No JVD, no bruit, no mass felt. Heme: No neck lymph node enlargement. Cardiac: S1/S2, RRR, No murmurs, No gallops or rubs. Respiratory: No rales, wheezing, rhonchi or rubs. GI:  Soft, nondistended, has central abdominal tenderness, no organomegaly, BS present. GU: No hematuria Ext: No pitting leg edema bilaterally. 1+DP/PT pulse bilaterally. Musculoskeletal: No joint deformities, No joint redness or warmth, no limitation of ROM in spin. Skin: No rashes.  Neuro: Confused, agitated, knows her own name, knows that she is in hospital, not orientated to the year 2024. Cranial nerves II-XII grossly intact, moves all extremities. Psych: pt has agitation  Labs on Admission: I have personally reviewed following labs and imaging studies  CBC: Recent Labs  Lab 06/23/22 1004  WBC 10.1  HGB 15.5*  HCT 49.7*  MCV 86.3  PLT 246   Basic Metabolic Panel: Recent Labs  Lab 06/23/22 1004 06/23/22 1249 06/23/22 1632  NA 148* 150* 157*  K  5.1 4.5 3.6  CL 109 114* 121*  CO2 12* 10* 22  GLUCOSE 794* 676* 345*  BUN 56* 57* 53*  CREATININE 2.24* 2.11* 1.76*  CALCIUM 10.1 9.7 9.9   GFR: Estimated Creatinine Clearance: 43.9 mL/min (A) (by C-G formula based on SCr of 1.76 mg/dL (H)). Liver Function Tests: Recent Labs  Lab 06/23/22 1004  AST 20  ALT 21  ALKPHOS 126  BILITOT 2.0*  PROT 8.9*  ALBUMIN 4.3   Recent Labs  Lab 06/23/22 1004  LIPASE 101*   No results for input(s): "AMMONIA" in the last 168 hours. Coagulation Profile: No results for input(s): "INR", "PROTIME" in the last 168 hours. Cardiac Enzymes: No results for input(s): "CKTOTAL", "CKMB", "CKMBINDEX", "TROPONINI" in the last 168 hours. BNP (last 3 results) No results for input(s): "PROBNP" in the last 8760 hours. HbA1C: No results for input(s): "HGBA1C" in the last 72 hours. CBG: Recent Labs  Lab 06/23/22 1426 06/23/22 1459 06/23/22 1531 06/23/22 1632 06/23/22 1737  GLUCAP 472* 438* 365* 274* 259*   Lipid Profile: Recent Labs    06/23/22 1632  TRIG 259*   Thyroid Function Tests: No results for input(s): "TSH", "T4TOTAL", "FREET4", "T3FREE", "THYROIDAB" in the last 72  hours. Anemia Panel: No results for input(s): "VITAMINB12", "FOLATE", "FERRITIN", "TIBC", "IRON", "RETICCTPCT" in the last 72 hours. Urine analysis:    Component Value Date/Time   COLORURINE YELLOW (A) 06/23/2022 0054   APPEARANCEUR CLEAR (A) 06/23/2022 0054   APPEARANCEUR Clear 12/19/2012 0014   LABSPEC 1.029 06/23/2022 0054   LABSPEC 1.010 12/19/2012 0014   PHURINE 5.0 06/23/2022 0054   GLUCOSEU >=500 (A) 06/23/2022 0054   GLUCOSEU Negative 12/19/2012 0014   HGBUR NEGATIVE 06/23/2022 0054   BILIRUBINUR NEGATIVE 06/23/2022 0054   BILIRUBINUR Negative 12/19/2012 0014   KETONESUR 20 (A) 06/23/2022 0054   PROTEINUR NEGATIVE 06/23/2022 0054   NITRITE NEGATIVE 06/23/2022 0054   LEUKOCYTESUR NEGATIVE 06/23/2022 0054   LEUKOCYTESUR 1+ 12/19/2012 0014   Sepsis Labs: @LABRCNTIP (procalcitonin:4,lacticidven:4) ) Recent Results (from the past 240 hour(s))  MRSA Next Gen by PCR, Nasal     Status: None   Collection Time: 06/23/22  2:37 PM   Specimen: Nasal Mucosa; Nasal Swab  Result Value Ref Range Status   MRSA by PCR Next Gen NOT DETECTED NOT DETECTED Final    Comment: (NOTE) The GeneXpert MRSA Assay (FDA approved for NASAL specimens only), is one component of a comprehensive MRSA colonization surveillance program. It is not intended to diagnose MRSA infection nor to guide or monitor treatment for MRSA infections. Test performance is not FDA approved in patients less than 48 years old. Performed at [facility], [address] [place], [place] [address]      Radiological Exams on Admission: No results found.    Assessment/Plan Principal Problem:   DKA (diabetic ketoacidosis) Active Problems:   Type II diabetes mellitus with renal manifestations   Acute metabolic encephalopathy   HTN (hypertension)   HLD (hyperlipidemia)   Dehydration   Acute renal failure superimposed on stage 3a chronic kidney disease   Elevated lactic acid level   Abdominal pain   OSA on  CPAP   Morbid obesity with BMI of 45.0-49.9, adult   Assessment and Plan:  DKA (diabetic ketoacidosis) and type II diabetes mellitus with renal manifestations: Recent A1c 8.2, poorly controlled.  Patient is taking Mounjaro, semaglutide at home.  Has DKA now  - Admit to stepdown as inpt - IVF:  3L of LR bolus - start DKA  protocol with BMP q4h - IVF: LR at 125 cc/h, will switch to D5-LR at 125 cc/h when CBG<250 - replete K as needed - Zofran prn nausea  - NPO  - consult to diabetic educator  Acute metabolic encephalopathy: Etiology is not clear.  Patient is very agitated, not following commands.  Moves all extremities, low suspicion for stroke. -Check UDS -Fall precaution -Frequent neurocheck -Follow-up CT head -consulted Dr. Belia Heman of ICU  HTN (hypertension) -IV hydralazine as needed -Continue home medications: Clonidine, oral hydralazine -Hold spironolactone since patient need IV fluid  HLD (hyperlipidemia) -Lipitor  Dehydration -IV fluid as above -Hold the spironolactone  Acute renal failure superimposed on stage 3a chronic kidney disease: Baseline creatinine 1.01 on 04/03/2022.  Creatinine to 2.24, BUN 56, GFR 25.  Likely due to dehydration -Avoid using renal toxic medications -Hold spironolactone -IV fluid as above  Elevated lactic acid level: Lactic acid 4.4 --> 3.5.  No signs of infection identified.  Likely due to dehydration -Trend lactic acid level -IV fluid as above  Abdominal pain: Etiology is not clear.  Lipase 101, indicating possible pancreatitis. -As needed Zofran, fentanyl -IV fluid as above -Follow-up with CT of abdomen/pelvis  OSA on CPAP -Holding CPAP due to altered mental status  Morbid obesity with BMI of 45.0-49.9, adult: 40 with 113.9 kg, BMI 44.48 -Admit to status improves, will need to encourage patient losing weight, take healthy diet and to do more exercise     DVT ppx: SQ Lovenox  Code Status: Full code  Family Communication: I  tried to call her daughter without success, her daughter's cell phone is not set up for leaving a message.   Disposition Plan:  Anticipate discharge back to previous environment  Consults called:   Dr. Belia Heman of ICU is consulted.  Admission status and Level of care: Stepdown:    as inpt      Dispo: The patient is from: Home              Anticipated d/c is to: Home              Anticipated d/c date is: 2 days              Patient currently is not medically stable to d/c.    Severity of Illness:  The appropriate patient status for this patient is INPATIENT. Inpatient status is judged to be reasonable and necessary in order to provide the required intensity of service to ensure the patient's safety. The patient's presenting symptoms, physical exam findings, and initial radiographic and laboratory data in the context of their chronic comorbidities is felt to place them at high risk for further clinical deterioration. Furthermore, it is not anticipated that the patient will be medically stable for discharge from the hospital within 2 midnights of admission.   * I certify that at the point of admission it is my clinical judgment that the patient will require inpatient hospital care spanning beyond 2 midnights from the point of admission due to high intensity of service, high risk for further deterioration and high frequency of surveillance required.*       Date of Service 06/23/2022    Lorretta Harp [facility]   If 7PM-7AM, please contact night-coverage [email or web address] 06/23/2022, 6:04 PM

## 2022-06-23 NOTE — Progress Notes (Signed)
IVT consult placed for PIV access. Assessed with ultrasound, with 1 attempt unsuccessful. Patient very restless during evaluation. Primary RN aware access was not obtained.

## 2022-06-23 NOTE — ED Provider Notes (Signed)
[facility] Provider Note    Event Date/Time   First MD Initiated Contact with Patient 06/23/22 [phone]     (approximate)  History   Chief Complaint: Hyperglycemia  HPI  Cynthia Ayers is a 57 y.o. female with a past medical history of type [place] who presents to the emergency department for altered mental status.  According to EMS report her coworkers called EMS when she did not show up for work.  EMS states they found the patient to be in bed still and seeming somewhat confused.  They checked a blood sugar which was greater than 500.  Patient is moaning at times does not clear if she is in pain.  She is not able to answer questions or follow commands.  EMS states they were able to get the patient to ambulate approximately 10 steps from her bed to the EMS stair chair.  Here patient mostly calm but at times will start moaning as if in pain.  Unable or unwilling to answer questions or follow commands.  Does move all extremities at times.  Physical Exam   Triage Vital Signs: ED Triage Vitals  Enc Vitals Group     BP      Pulse      Resp      Temp      Temp src      SpO2      Weight      Height      Head Circumference      Peak Flow      Pain Score      Pain Loc      Pain Edu?      Excl. in GC?     Most recent vital signs: There were no vitals filed for this visit.  General: Patient keeps her eyes closed throughout most of the exam.  Occasionally moans as if in pain or discomfort.  However she does not answer questions or follow commands. CV:  Good peripheral perfusion.  Regular rhythm rate about 120 bpm. Resp:  Increase respiratory rate with no significant work of breathing.  No wheeze or rales or rhonchi. Abd:  No distention.  Soft, nontender.  No rebound or guarding.  No reaction to abdominal palpation.  ED Results / Procedures / Treatments   EKG  EKG viewed and interpreted by myself shows sinus tachycardia 117 bpm with a narrow QRS, left axis  deviation, largely normal intervals with nonspecific ST changes.  MEDICATIONS ORDERED IN ED: Medications  lactated ringers bolus 1,000 mL (has no administration in time range)     IMPRESSION / MDM / ASSESSMENT AND PLAN / ED COURSE  I reviewed the triage vital signs and the nursing notes.  Patient's presentation is most consistent with acute presentation with potential threat to life or bodily function.  Patient presents emergency department for altered mental status found to be hyperglycemic.  Concern for possible DKA we will check labs including VBG we will begin IV hydration.  We will continue to closely monitor while awaiting results.  She does appear to spontaneously move all extremities well, no obvious deficits identified.  Patient's labs have resulted showing significant hyperglycemia 794 with an anion gap of 27 concerning for diabetic ketoacidosis.  Patient's beta hydroxybutyric acid elevated greater than 8 lactic acid elevated at 3.5.  Patient's VBG reassuringly shows a pH of 7.38 with significant respiratory compensation with a pCO2 of 19.  CBC shows no concerning findings, urine drug screen is negative.  Given the patient's concerning labs for DKA we will continue with IV hydration patient has been started on insulin infusion.  Patient still appears confused although her mentation appears to be improving.  She is now able to answer basic questions for me.  Patient will be admitted to the stepdown service.  CRITICAL CARE Performed by: Minna Antis   Total critical care time: 30 minutes  Critical care time was exclusive of separately billable procedures and treating other patients.  Critical care was necessary to treat or prevent imminent or life-threatening deterioration.  Critical care was time spent personally by me on the following activities: development of treatment plan with patient and/or surrogate as well as nursing, discussions with consultants, evaluation of  patient's response to treatment, examination of patient, obtaining history from patient or surrogate, ordering and performing treatments and interventions, ordering and review of laboratory studies, ordering and review of radiographic studies, pulse oximetry and re-evaluation of patient's condition.   FINAL CLINICAL IMPRESSION(S) / ED DIAGNOSES   Altered mental status Hyperglycemia Diabetic ketoacidosis  Note:  This document was prepared using Dragon voice recognition software and may include unintentional dictation errors.   Minna Antis, MD 06/23/22 [phone]

## 2022-06-23 NOTE — Consult Note (Signed)
NAME:  Cynthia Ayers, MRN:  [id number], DOB:  [birth year], LOS: 0 ADMISSION DATE:  06/23/2022, CONSULTATION DATE: 06/23/2022 REFERRING MD: Dr. Clyde Lundborg, CHIEF COMPLAINT: AMS   History of Present Illness:  This is a 57 yo female with a PMH of Type II Diabetes Mellitus who presented to [facility] ER via EMS on 04/23 with altered mental status.  Per ER notes EMS reported the pts coworkers called EMS when the pt did not show up to work today.  When EMS arrived at pts home she was found in bed confused.  EMS checked pts blood sugar and it was >500.  According to EMS the pt was able to ambulate 10 steps from her bed to the EMS chair.    ED Course  Upon arrival to the ER pt unable to follow commands, but she was able to move all extremities.  ER lab results were: Na+ 148/CO2 12/glucose 794/BUN 56/creatinine 2.24/anion gap 27/lactic acid 4.4/beta-hydroxy >8.00.  UA negative for UTI.  Pt ruled in for DKA, therefore insulin gtt initiated.  Pt admitted to the stepdown unit for additional workup and treatment per hospitalist team.  See detailed hospital course below for additional workup and treatment.   Pertinent  Medical History  Stage 3a CKD  HLD HTN Morbid Obesity  OSA (CPAP qhs) Type II Diabetes Mellitus   Significant Hospital Events: Including procedures, antibiotic start and stop dates in addition to other pertinent events   04/23: Pt admitted with acute metabolic encephalopathy and DKA requiring insulin gtt.  PCCM consulted due to worsening encephalopathy and to initiate precedex gtt   Interim History / Subjective:  Pt intermittently agitated despite 4 mg iv haldol and 2 mg iv morphine x1 dose requiring precedex gtt   Objective   Blood pressure (!) 144/91, pulse (!) 135, temperature 98.1 F (36.7 C), temperature source Axillary, resp. rate (!) 28, height  (1.6 m), weight 113.9 kg, SpO2 100 %.        Intake/Output Summary (Last 24 hours) at 06/23/2022 1535 Last data filed at 06/23/2022 1425 Gross  per 24 hour  Intake 0.5 ml  Output 850 ml  Net -849.5 ml   Filed Weights   06/23/22 1030 06/23/22 1415  Weight: 121.6 kg 113.9 kg    Examination: General: Acute on chronically-ill appearing female, intermittent agitation  HENT: Supple, no JVD  Lungs: Diminished throughout, even, non labored  Cardiovascular: Sinus tachycardia, s1s2, no m/r/g, 2+ radial/2+ distal pulses, no edema  Abdomen: Hypoactive BS x4, obese, soft, tenderness  Extremities: Normal bulk and tone, moves all extremities  Neuro: Confused, not following commands, moving all extremities, PERRLA GU: Deferred   Resolved Hospital Problem list    Assessment & Plan:   #Diabetic ketoacidosis  - Continue insulin gtt until anion gap closed and serum CO2 20 or higher  - BMP's q4hrs and beta-hydroxybutyric acid q8hrs while on insulin gtt  - CBG's per glucose stabilizer  - IV fluids per DKA protocol  - Diabetes coordinator consulted appreciate input   #HTN~stable  Hx: HLD  - Continuous telemetry monitoring  - Prn hydralazine for bp management  - Resume outpatient antihypertensives and statin once pt able to tolerate po's   #Acute kidney injury on CKD stage 3a #Hypernatremia  #Severe metabolic acidosis  #Lactic acidosis  - Trend BMP, lactic acid, and vbg   - Replace electrolytes as indicated  - Monitor UOP - Avoid nephrotoxic medications  - Will give 2 amps of sodium bicarb   #Nausea/Vomiting  #Abdominal  pain  - Follow amylase and lipase  - CT Abd/Pelvis pending to r/o acute pancreatitis  - Prn zofran for nausea/vomiting  - Prn fentanyl for pain management   #OSA  - CPAP qhs once able to tolerate  - Supplemental O2 for dyspnea and/or hypoxia   Acute metabolic encephalopathy in the setting of DKA - Correct metabolic derangements  - CT Head pending  - Continue supportive care  - Avoid sedating medications when able   Best Practice (right click and "Reselect all SmartList Selections" daily)   Diet/type:  NPO DVT prophylaxis: LMWH GI prophylaxis: PPI Lines: N/A Foley:  N/A Code Status:  full code Last date of multidisciplinary goals of care discussion [N/A]  Labs   CBC: Recent Labs  Lab 06/23/22 1004  WBC 10.1  HGB 15.5*  HCT 49.7*  MCV 86.3  PLT 246    Basic Metabolic Panel: Recent Labs  Lab 06/23/22 1004 06/23/22 1249  NA 148* 150*  K 5.1 4.5  CL 109 114*  CO2 12* 10*  GLUCOSE 794* 676*  BUN 56* 57*  CREATININE 2.24* 2.11*  CALCIUM 10.1 9.7   GFR: Estimated Creatinine Clearance: 36.6 mL/min (A) (by C-G formula based on SCr of 2.11 mg/dL (H)). Recent Labs  Lab 06/23/22 1004 06/23/22 1154  WBC 10.1  --   LATICACIDVEN 4.4* 3.5*    Liver Function Tests: Recent Labs  Lab 06/23/22 1004  AST 20  ALT 21  ALKPHOS 126  BILITOT 2.0*  PROT 8.9*  ALBUMIN 4.3   Recent Labs  Lab 06/23/22 1004  LIPASE 101*   No results for input(s): "AMMONIA" in the last 168 hours.  ABG    Component Value Date/Time   HCO3 11.2 (L) 06/23/2022 0956   ACIDBASEDEF 11.5 (H) 06/23/2022 0956   O2SAT 93.2 06/23/2022 0956     Coagulation Profile: No results for input(s): "INR", "PROTIME" in the last 168 hours.  Cardiac Enzymes: No results for input(s): "CKTOTAL", "CKMB", "CKMBINDEX", "TROPONINI" in the last 168 hours.  HbA1C: No results found for: "HGBA1C"  CBG: Recent Labs  Lab 06/23/22 1245 06/23/22 1341 06/23/22 1426 06/23/22 1459 06/23/22 1531  GLUCAP 568* 468* 472* 438* 365*    Review of Systems:   Unable to assess pt confused   Past Medical History:  She,  has a past medical history of Chronic renal failure, stage 3a, HLD (hyperlipidemia), HTN (hypertension), Morbid obesity with BMI of 45.0-49.9, adult, OSA on CPAP, and Type II diabetes mellitus with renal manifestations.   Surgical History:     Social History:   reports that she has never smoked. She has never been exposed to tobacco smoke. She has never used smokeless tobacco.   Family History:   Her family history is not on file.   Allergies Not on File   Home Medications  Prior to Admission medications   Medication Sig Start Date End Date Taking? Authorizing Provider  Armodafinil 150 MG tablet Take 150 mg by mouth daily. 05/15/22  Yes [provider]  atorvastatin (LIPITOR) 20 MG tablet Take 20 mg by mouth at bedtime. 05/06/22 05/06/23 Yes [provider]  cloNIDine (CATAPRES) 0.3 MG tablet Take 0.3 mg by mouth 2 (two) times daily. 04/28/22 04/28/23 Yes [provider]  hydrALAZINE (APRESOLINE) 100 MG tablet Take 1 tablet by mouth 3 (three) times daily. 12/24/21  Yes [provider]  magnesium oxide (MAG-OX) 400 (240 Mg) MG tablet Take 1 tablet by mouth 2 (two) times daily. 05/17/22  Yes [provider]  medroxyPROGESTERone (PROVERA) 10 MG tablet Take by mouth. 04/07/22  Yes [provider]  megestrol (MEGACE) 40 MG tablet Take by mouth. 04/28/22 04/28/23 Yes [provider]  meloxicam (MOBIC) 15 MG tablet Take by mouth. 12/09/21 12/09/22 Yes [provider]  mirabegron ER (MYRBETRIQ) 25 MG TB24 tablet Take 1 tablet by mouth daily. 06/18/22  Yes [provider]  montelukast (SINGULAIR) 10 MG tablet Take 1 tablet by mouth at bedtime. 04/28/22  Yes [provider]  ofloxacin (FLOXIN) 0.3 % OTIC solution SMARTSIG:5 Drop(s) Right Ear Twice Daily 05/18/22  Yes [provider]  pantoprazole (PROTONIX) 40 MG tablet Take by mouth. 04/28/22 04/28/23 Yes [provider]  potassium chloride (KLOR-CON M) 10 MEQ tablet Take by mouth. 04/28/22  Yes [provider]  predniSONE (DELTASONE) 10 MG tablet 3 tabs daily for 3 days, 2 tab daily for 3 days, 1 tab for 3 day 06/15/22  Yes [provider]  Semaglutide, 2 MG/DOSE, 8 MG/3ML SOPN Inject into the skin. 03/19/22  Yes [provider]  spironolactone (ALDACTONE) 50 MG tablet Take 1 tablet by mouth daily. 04/28/22 04/28/23 Yes [provider]  Armodafinil 250 MG tablet Take 250 mg by mouth daily. Patient not taking: Reported on 06/23/2022 05/29/22   [provider]  fluconazole (DIFLUCAN) 100 MG tablet Take 100 mg by mouth daily. Patient not taking: Reported on 06/23/2022 06/15/22   [provider]  ketorolac (TORADOL) 10 MG tablet Take 10 mg by mouth every 6 (six) hours as needed. Patient not taking: Reported on 06/23/2022 03/20/22   [provider]  [facility] 10 MG/0.5ML Pen Inject into the skin. Patient not taking: Reported on 06/23/2022 03/02/22   [provider]  traMADol (ULTRAM) 50 MG tablet Take by mouth. Patient not taking: Reported on 06/23/2022 05/22/22   [provider]     Critical care time: 55 minutes      Zada Girt, AGNP  Pulmonary/Critical Care Pager [phone] (please enter 7 digits) PCCM Consult Pager [phone] (please enter 7 digits)

## 2022-06-24 ENCOUNTER — Inpatient Hospital Stay: Payer: 59

## 2022-06-24 ENCOUNTER — Inpatient Hospital Stay (HOSPITAL_COMMUNITY)
Admit: 2022-06-24 | Discharge: 2022-06-24 | Disposition: A | Discharge status: Home / Self Care | Payer: 59 | Attending: Pulmonary Disease | Admitting: Pulmonary Disease

## 2022-06-24 DIAGNOSIS — I428 Other cardiomyopathies: Secondary | ICD-10-CM

## 2022-06-24 DIAGNOSIS — E1311 Other specified diabetes mellitus with ketoacidosis with coma: Secondary | ICD-10-CM

## 2022-06-24 DIAGNOSIS — E1110 Type 2 diabetes mellitus with ketoacidosis without coma: Secondary | ICD-10-CM | POA: Diagnosis not present

## 2022-06-24 LAB — SODIUM, URINE, RANDOM: Sodium, Ur: 29 mmol/L

## 2022-06-24 LAB — BASIC METABOLIC PANEL
Anion gap: 10 (ref 5–15)
Anion gap: 6 (ref 5–15)
Anion gap: 12 (ref 5–15)
BUN: 55 mg/dL — ABNORMAL HIGH (ref 6–20)
BUN: 50 mg/dL — ABNORMAL HIGH (ref 6–20)
BUN: 47 mg/dL — ABNORMAL HIGH (ref 6–20)
CO2: 27 mmol/L (ref 22–32)
CO2: 27 mmol/L (ref 22–32)
CO2: 23 mmol/L (ref 22–32)
Calcium: 9.6 mg/dL (ref 8.9–10.3)
Calcium: 9.4 mg/dL (ref 8.9–10.3)
Calcium: 8.9 mg/dL (ref 8.9–10.3)
Chloride: 120 mmol/L — ABNORMAL HIGH (ref 98–111)
Chloride: 123 mmol/L — ABNORMAL HIGH (ref 98–111)
Chloride: 118 mmol/L — ABNORMAL HIGH (ref 98–111)
Creatinine, Ser: 1.65 mg/dL — ABNORMAL HIGH (ref 0.44–1.00)
Creatinine, Ser: 1.67 mg/dL — ABNORMAL HIGH (ref 0.44–1.00)
Creatinine, Ser: 1.52 mg/dL — ABNORMAL HIGH (ref 0.44–1.00)
GFR, Estimated: 36 mL/min — ABNORMAL LOW (ref >60)
GFR, Estimated: 36 mL/min — ABNORMAL LOW (ref >60)
GFR, Estimated: 40 mL/min — ABNORMAL LOW (ref >60)
Glucose, Bld: 235 mg/dL — ABNORMAL HIGH (ref 70–99)
Glucose, Bld: 203 mg/dL — ABNORMAL HIGH (ref 70–99)
Glucose, Bld: 326 mg/dL — ABNORMAL HIGH (ref 70–99)
Potassium: 3.8 mmol/L (ref 3.5–5.1)
Potassium: 3.6 mmol/L (ref 3.5–5.1)
Potassium: 4.3 mmol/L (ref 3.5–5.1)
Sodium: 157 mmol/L — ABNORMAL HIGH (ref 135–145)
Sodium: 156 mmol/L — ABNORMAL HIGH (ref 135–145)
Sodium: 153 mmol/L — ABNORMAL HIGH (ref 135–145)

## 2022-06-24 LAB — MAGNESIUM: Magnesium: 3 mg/dL — ABNORMAL HIGH (ref 1.7–2.4)

## 2022-06-24 LAB — BLOOD GAS, VENOUS
Acid-Base Excess: 6.6 mmol/L — ABNORMAL HIGH (ref 0.0–2.0)
Bicarbonate: 31.9 mmol/L — ABNORMAL HIGH (ref 20.0–28.0)
O2 Saturation: 75.1 %
Patient temperature: 37
pCO2, Ven: 47 mmHg (ref 44–60)
pH, Ven: 7.44 — ABNORMAL HIGH (ref 7.25–7.43)
pO2, Ven: 48 mmHg — ABNORMAL HIGH (ref 32–45)

## 2022-06-24 LAB — HEPATIC FUNCTION PANEL
ALT: 16 U/L (ref 0–44)
AST: 19 U/L (ref 15–41)
Albumin: 3.7 g/dL (ref 3.5–5.0)
Alkaline Phosphatase: 101 U/L (ref 38–126)
Bilirubin, Direct: 0.1 mg/dL (ref 0.0–0.2)
Indirect Bilirubin: 1.1 mg/dL — ABNORMAL HIGH (ref 0.3–0.9)
Total Bilirubin: 1.2 mg/dL (ref 0.3–1.2)
Total Protein: 7.3 g/dL (ref 6.5–8.1)

## 2022-06-24 LAB — CBC
HCT: 46.7 % — ABNORMAL HIGH (ref 36.0–46.0)
Hemoglobin: 14.7 g/dL (ref 12.0–15.0)
MCH: 27 pg (ref 26.0–34.0)
MCHC: 31.5 g/dL (ref 30.0–36.0)
MCV: 85.7 fL (ref 80.0–100.0)
Platelets: 202 10*3/uL (ref 150–400)
RBC: 5.45 MIL/uL — ABNORMAL HIGH (ref 3.87–5.11)
RDW: 14.5 % (ref 11.5–15.5)
WBC: 12.7 10*3/uL — ABNORMAL HIGH (ref 4.0–10.5)
nRBC: 0 % (ref 0.0–0.2)

## 2022-06-24 LAB — ECHOCARDIOGRAM COMPLETE
AR max vel: 2.83 cm2
AV Area VTI: 2.96 cm2
AV Area mean vel: 2.69 cm2
AV Mean grad: 5 mmHg
AV Peak grad: 10.4 mmHg
Ao pk vel: 1.61 m/s
Area-P 1/2: 4.01 cm2
Height: 63 in
MV VTI: 3.67 cm2
S' Lateral: 2.3 cm
Weight: 4017.66 oz

## 2022-06-24 LAB — GLUCOSE, CAPILLARY
Glucose-Capillary: 155 mg/dL — ABNORMAL HIGH (ref 70–99)
Glucose-Capillary: 176 mg/dL — ABNORMAL HIGH (ref 70–99)
Glucose-Capillary: 177 mg/dL — ABNORMAL HIGH (ref 70–99)
Glucose-Capillary: 177 mg/dL — ABNORMAL HIGH (ref 70–99)
Glucose-Capillary: 187 mg/dL — ABNORMAL HIGH (ref 70–99)
Glucose-Capillary: 191 mg/dL — ABNORMAL HIGH (ref 70–99)
Glucose-Capillary: 200 mg/dL — ABNORMAL HIGH (ref 70–99)
Glucose-Capillary: 206 mg/dL — ABNORMAL HIGH (ref 70–99)
Glucose-Capillary: 223 mg/dL — ABNORMAL HIGH (ref 70–99)
Glucose-Capillary: 226 mg/dL — ABNORMAL HIGH (ref 70–99)
Glucose-Capillary: 241 mg/dL — ABNORMAL HIGH (ref 70–99)
Glucose-Capillary: 331 mg/dL — ABNORMAL HIGH (ref 70–99)
Glucose-Capillary: 361 mg/dL — ABNORMAL HIGH (ref 70–99)
Glucose-Capillary: 386 mg/dL — ABNORMAL HIGH (ref 70–99)

## 2022-06-24 LAB — LIPASE, BLOOD: Lipase: 546 U/L — ABNORMAL HIGH (ref 11–51)

## 2022-06-24 LAB — OSMOLALITY, URINE: Osmolality, Ur: 744 mOsm/kg (ref 300–900)

## 2022-06-24 LAB — PHOSPHORUS: Phosphorus: 2.6 mg/dL (ref 2.5–4.6)

## 2022-06-24 LAB — HIV ANTIBODY (ROUTINE TESTING W REFLEX): HIV Screen 4th Generation wRfx: NONREACTIVE

## 2022-06-24 LAB — BETA-HYDROXYBUTYRIC ACID: Beta-Hydroxybutyric Acid: 0.48 mmol/L — ABNORMAL HIGH (ref 0.05–0.27)

## 2022-06-24 LAB — PROCALCITONIN: Procalcitonin: <0.1 ng/mL

## 2022-06-24 LAB — AMYLASE: Amylase: 729 U/L — ABNORMAL HIGH (ref 28–100)

## 2022-06-24 MED ORDER — FREE WATER
100.0000 mL | Freq: Four times a day (QID) | Status: DC
  Administered 2022-06-24 – 2022-06-25 (×2): 100 mL

## 2022-06-24 MED ORDER — DEXTROSE 5 % IV SOLN: INTRAVENOUS | Status: DC

## 2022-06-24 MED ORDER — THIAMINE HCL 100 MG/ML IJ SOLN
100.0000 mg | Freq: Every day | INTRAMUSCULAR | Status: DC
  Administered 2022-06-24 – 2022-06-27 (×4): 100 mg via INTRAVENOUS
  Filled 2022-06-24 (×5): qty 2

## 2022-06-24 MED ORDER — ORAL CARE MOUTH RINSE: 15.0000 mL | OROMUCOSAL | Status: DC | PRN

## 2022-06-24 MED ORDER — ORAL CARE MOUTH RINSE
15.0000 mL | OROMUCOSAL | Status: DC
  Administered 2022-06-24 – 2022-06-29 (×14): 15 mL via OROMUCOSAL

## 2022-06-24 MED ORDER — ADULT MULTIVITAMIN W/MINERALS CH
1.0000 | ORAL_TABLET | Freq: Every day | ORAL | Status: DC
  Administered 2022-06-24 – 2022-06-26 (×3): 1
  Filled 2022-06-24 (×3): qty 1

## 2022-06-24 MED ORDER — POTASSIUM CL IN DEXTROSE 5% 20 MEQ/L IV SOLN
20.0000 meq | INTRAVENOUS | Status: DC
  Administered 2022-06-24: 20 meq via INTRAVENOUS
  Filled 2022-06-24 (×2): qty 1000

## 2022-06-24 MED ORDER — MEGESTROL ACETATE 20 MG PO TABS
40.0000 mg | ORAL_TABLET | Freq: Every day | ORAL | Status: DC
  Administered 2022-06-24 – 2022-06-26 (×3): 40 mg
  Filled 2022-06-24 (×3): qty 2

## 2022-06-24 MED ORDER — VITAL 1.5 CAL PO LIQD
1000.0000 mL | ORAL | Status: DC
  Administered 2022-06-24: 1000 mL

## 2022-06-24 MED ORDER — POTASSIUM CL IN DEXTROSE 5% 20 MEQ/L IV SOLN
20.0000 meq | INTRAVENOUS | Status: DC
  Filled 2022-06-24: qty 1000

## 2022-06-24 MED ORDER — ACETAMINOPHEN 325 MG PO TABS
650.0000 mg | ORAL_TABLET | Freq: Four times a day (QID) | ORAL | Status: DC | PRN
  Filled 2022-06-24: qty 2

## 2022-06-24 MED ORDER — MONTELUKAST SODIUM 10 MG PO TABS
10.0000 mg | ORAL_TABLET | Freq: Every day | ORAL | Status: DC
  Administered 2022-06-24 – 2022-06-25 (×2): 10 mg
  Filled 2022-06-24 (×2): qty 1

## 2022-06-24 MED ORDER — FOLIC ACID 5 MG/ML IJ SOLN
1.0000 mg | Freq: Every day | INTRAMUSCULAR | Status: DC
  Administered 2022-06-24 – 2022-06-27 (×4): 1 mg via INTRAVENOUS
  Filled 2022-06-24 (×6): qty 0.2

## 2022-06-24 MED ORDER — CLONIDINE HCL 0.1 MG PO TABS
0.3000 mg | ORAL_TABLET | Freq: Two times a day (BID) | ORAL | Status: DC
  Administered 2022-06-24 – 2022-06-26 (×5): 0.3 mg
  Filled 2022-06-24 (×5): qty 3

## 2022-06-24 MED ORDER — LORAZEPAM 2 MG/ML IJ SOLN: 1.0000 mg | INTRAMUSCULAR | Status: DC | PRN

## 2022-06-24 MED ORDER — LORAZEPAM 1 MG PO TABS: 1.0000 mg | ORAL_TABLET | ORAL | Status: DC | PRN

## 2022-06-24 MED ORDER — ATORVASTATIN CALCIUM 20 MG PO TABS
20.0000 mg | ORAL_TABLET | Freq: Every day | ORAL | Status: DC
  Administered 2022-06-24 – 2022-06-26 (×3): 20 mg
  Filled 2022-06-24 (×3): qty 1

## 2022-06-24 MED ORDER — INSULIN ASPART 100 UNIT/ML IJ SOLN
0.0000 [IU] | INTRAMUSCULAR | Status: DC
  Administered 2022-06-24: 5 [IU] via SUBCUTANEOUS
  Administered 2022-06-24 (×2): 15 [IU] via SUBCUTANEOUS
  Administered 2022-06-24 – 2022-06-25 (×2): 11 [IU] via SUBCUTANEOUS
  Administered 2022-06-25: 5 [IU] via SUBCUTANEOUS
  Administered 2022-06-25 (×2): 8 [IU] via SUBCUTANEOUS
  Administered 2022-06-25: 5 [IU] via SUBCUTANEOUS
  Administered 2022-06-25: 15 [IU] via SUBCUTANEOUS
  Administered 2022-06-26: 5 [IU] via SUBCUTANEOUS
  Administered 2022-06-26: 11 [IU] via SUBCUTANEOUS
  Administered 2022-06-26: 8 [IU] via SUBCUTANEOUS
  Administered 2022-06-26: 11 [IU] via SUBCUTANEOUS
  Administered 2022-06-26: 8 [IU] via SUBCUTANEOUS
  Administered 2022-06-27 (×2): 3 [IU] via SUBCUTANEOUS
  Administered 2022-06-27: 8 [IU] via SUBCUTANEOUS
  Administered 2022-06-27 (×2): 11 [IU] via SUBCUTANEOUS
  Administered 2022-06-27: 3 [IU] via SUBCUTANEOUS
  Administered 2022-06-28 (×4): 5 [IU] via SUBCUTANEOUS
  Administered 2022-06-28: 15 [IU] via SUBCUTANEOUS
  Administered 2022-06-28: 11 [IU] via SUBCUTANEOUS
  Administered 2022-06-29: 2 [IU] via SUBCUTANEOUS
  Administered 2022-06-29: 3 [IU] via SUBCUTANEOUS
  Administered 2022-06-29: 5 [IU] via SUBCUTANEOUS
  Filled 2022-06-24 (×27): qty 1

## 2022-06-24 MED ORDER — INSULIN GLARGINE-YFGN 100 UNIT/ML ~~LOC~~ SOLN
14.0000 [IU] | Freq: Two times a day (BID) | SUBCUTANEOUS | Status: DC
  Administered 2022-06-24 – 2022-06-25 (×4): 14 [IU] via SUBCUTANEOUS
  Filled 2022-06-24 (×5): qty 0.14

## 2022-06-24 MED ORDER — FAMOTIDINE 20 MG PO TABS
20.0000 mg | ORAL_TABLET | Freq: Every day | ORAL | Status: DC
  Administered 2022-06-24 – 2022-06-26 (×3): 20 mg
  Filled 2022-06-24 (×3): qty 1

## 2022-06-24 MED ORDER — HYDRALAZINE HCL 50 MG PO TABS
100.0000 mg | ORAL_TABLET | Freq: Three times a day (TID) | ORAL | Status: DC
  Administered 2022-06-24 – 2022-06-26 (×7): 100 mg
  Filled 2022-06-24 (×7): qty 2

## 2022-06-24 NOTE — Progress Notes (Addendum)
PROGRESS NOTE    Cynthia Ayers  WUJ:[id number] DOB: 12-26-1965 DOA: 06/23/2022 PCP: Alm Bustard, NP     Brief Narrative:   From admission h and p  Cynthia Ayers is a 57 y.o. female with medical history significant of DM, HTN, HLD, morbid obesity, OSA on CPAP, depression with anxiety, CKD 3A, who presents with altered mental status.   Patient has AMS, can only provide limited medical history.  I tried to call her daughter without success, her daughter's cell phone is not set up for leaving a message. Therefore, most of the history is obtained by discussing the case with ED physician, per EMS report, and with the nursing staff.    Per report, pts coworkers called EMS when the pt did not show up to work today.  When EMS arrived at pts home she was found in bed confused.They checked a blood sugar which was greater than 500. Per EDP, initially patient could not answer questions or follow commands.  Patient was given IV fluid in ED, and started on insulin drip.  Her mental status improved slightly.  When I saw patient in ED, patient is confused about year 2024.  She knows her own name, knows that she is in the hospital.  She denies chest pain.  No active respiratory distress or cough noted.  She complains of abdominal pain, but cannot provide detailed information.  No active nausea, vomiting or diarrhea noted.  She moves all extremities. Later on, after pt is transferred to SDU, she becomes very agitated, not following command.   Later day of admission transferred to ICU with severe encephalopathy. Trialed precedex but developed bradycardia  Assessment & Plan:   Principal Problem:   DKA (diabetic ketoacidosis) Active Problems:   Type II diabetes mellitus with renal manifestations   Acute metabolic encephalopathy   HTN (hypertension)   HLD (hyperlipidemia)   Dehydration   Acute renal failure superimposed on stage 3a chronic kidney disease   Elevated lactic acid level   Abdominal pain    OSA on CPAP   Morbid obesity with BMI of 45.0-49.9, adult  # DKA # T2DM Pancreatitis likely contributory. Recent steroids also likely contributory. A1c 8.2. gap closed, last beta-h was close to normal - will stop insulin gtt and start basal/bolus, semglee 14 bid, q4 cbg, SSI 0-15 qr, appreciate dm educator recs - will add kcl to d5 infusion  # Acute metabolic encephalopathy Likely 2/2 dka, pancreatitis. Appears to be improving. CT head neg. UDS unremarkable - monitor - has NG tube, may need feeding if mental status does not clear in the next day or two - pt/ot consults for tomorrow  # Bradycardia With precedex, resolved - tte pending  # Acute pancreatitis Mild elevation of pancreatic enzymes, non-con CT showing pancreatitis. Triglycerides not significantly elevated. T bili is mildly elevated. No stones seen on CT. Home semaglutide  may have caused, would hold at d/c - will get ruq u/s to further evaluate gallbladder and biliary tree - continue fluids, anti-emetic  # Hypernatremia Likely 2/2 dehydration - continue d5 - add free water flushes  # HTN Mild elevation -  cont home clonidine and hydral - home spiro on hold  # AKI Baseline cr of around 1, presented with 2.25, improved to 1.67 today with fluids - continue fluids and monitor  # Narcolepsy? - resume home armodafinil when more lucid  # Asthma - dulera for home advair  # Acute urinary retention Foley has been placed - will plan on  trial of void when more lucid - home oxybutynin and mirabegron on hold  # OSA - cpap qhs  # obesity Noted    DVT prophylaxis: lovenox Code Status: full Family Communication: daughter updated telephonically 4/24  Level of care: Stepdown Status is: Inpatient Remains inpatient appropriate because: severity of illness    Consultants:  pccm  Procedures: none  Antimicrobials:  none    Subjective: Confused, complaining of abd pain  Objective: Vitals:   06/24/22  0400 06/24/22 0500 06/24/22 0550 06/24/22 0600  BP: 131/77 137/74  (!) 155/96  Pulse: (!) 117 (!) 114  (!) 121  Resp: (!) 21 (!) 24  (!) 36  Temp:   98.1 F (36.7 C)   TempSrc:   Oral   SpO2: 99% 99%  100%  Weight:      Height:        Intake/Output Summary (Last 24 hours) at 06/24/2022 0814 Last data filed at 06/24/2022 0600 Gross per 24 hour  Intake 5994.27 ml  Output 1900 ml  Net 4094.27 ml   Filed Weights   06/23/22 1030 06/23/22 1415  Weight: 121.6 kg 113.9 kg    Examination:  General exam: appears ill  Respiratory system: Clear to auscultation. Respiratory effort normal. Cardiovascular system: tachycardic Gastrointestinal system: Abdomen is obese, soft and nontender. No organomegaly or masses felt. Normal bowel sounds heard. Central nervous system: awake but somnolent, moving all 4 Extremities: Symmetric 5 x 5 power. Skin: No rashes, lesions or ulcers Psychiatry: confused    Data Reviewed: I have personally reviewed following labs and imaging studies  CBC: Recent Labs  Lab 06/23/22 1004 06/24/22 0338  WBC 10.1 12.7*  HGB 15.5* 14.7  HCT 49.7* 46.7*  MCV 86.3 85.7  PLT 246 202   Basic Metabolic Panel: Recent Labs  Lab 06/23/22 1632 06/23/22 1818 06/23/22 2122 06/24/22 0338 06/24/22 0729  NA 157* 158* 159* 157* 156*  K 3.6 3.5 3.7 3.8 3.6  CL 121* 122* 124* 120* 123*  CO2 GLUCOSE 345* 257* 211* 235* 203*  BUN 53* 54* 55* 55* 50*  CREATININE 1.76* 1.64* 1.67* 1.65* 1.67*  CALCIUM 9.9 9.6 9.5 9.6 9.4  MG  --   --  3.3*  --   --   PHOS  --   --  2.1*  --   --    GFR: Estimated Creatinine Clearance: 46.3 mL/min (A) (by C-G formula based on SCr of 1.67 mg/dL (H)). Liver Function Tests: Recent Labs  Lab 06/23/22 1004 06/24/22 0338  AST 20 19  ALT 21 16  ALKPHOS 126 101  BILITOT 2.0* 1.2  PROT 8.9* 7.3  ALBUMIN 4.3 3.7   Recent Labs  Lab 06/23/22 1004 06/24/22 0338  LIPASE 101* 546*  AMYLASE  --  729*   No results for  input(s): "AMMONIA" in the last 168 hours. Coagulation Profile: No results for input(s): "INR", "PROTIME" in the last 168 hours. Cardiac Enzymes: No results for input(s): "CKTOTAL", "CKMB", "CKMBINDEX", "TROPONINI" in the last 168 hours. BNP (last 3 results) No results for input(s): "PROBNP" in the last 8760 hours. HbA1C: No results for input(s): "HGBA1C" in the last 72 hours. CBG: Recent Labs  Lab 06/24/22 0409 06/24/22 0508 06/24/22 0609 06/24/22 0704 06/24/22 0758  GLUCAP 223* 206* 200* 241* 155*   Lipid Profile: Recent Labs    06/23/22 1632  TRIG 259*   Thyroid Function Tests: No results for input(s): "TSH", "T4TOTAL", "FREET4", "T3FREE", "THYROIDAB" in the last 72  hours. Anemia Panel: No results for input(s): "VITAMINB12", "FOLATE", "FERRITIN", "TIBC", "IRON", "RETICCTPCT" in the last 72 hours. Urine analysis:    Component Value Date/Time   COLORURINE YELLOW (A) 06/23/2022 0054   APPEARANCEUR CLEAR (A) 06/23/2022 0054   APPEARANCEUR Clear 12/19/2012 0014   LABSPEC 1.029 06/23/2022 0054   LABSPEC 1.010 12/19/2012 0014   PHURINE 5.0 06/23/2022 0054   GLUCOSEU >=500 (A) 06/23/2022 0054   GLUCOSEU Negative 12/19/2012 0014   HGBUR NEGATIVE 06/23/2022 0054   BILIRUBINUR NEGATIVE 06/23/2022 0054   BILIRUBINUR Negative 12/19/2012 0014   KETONESUR 20 (A) 06/23/2022 0054   PROTEINUR NEGATIVE 06/23/2022 0054   NITRITE NEGATIVE 06/23/2022 0054   LEUKOCYTESUR NEGATIVE 06/23/2022 0054   LEUKOCYTESUR 1+ 12/19/2012 0014   Sepsis Labs: @LABRCNTIP (procalcitonin:4,lacticidven:4)  ) Recent Results (from the past 240 hour(s))  MRSA Next Gen by PCR, Nasal     Status: None   Collection Time: 06/23/22  2:37 PM   Specimen: Nasal Mucosa; Nasal Swab  Result Value Ref Range Status   MRSA by PCR Next Gen NOT DETECTED NOT DETECTED Final    Comment: (NOTE) The GeneXpert MRSA Assay (FDA approved for NASAL specimens only), is one component of a comprehensive MRSA colonization  surveillance program. It is not intended to diagnose MRSA infection nor to guide or monitor treatment for MRSA infections. Test performance is not FDA approved in patients less than 49 years old. Performed at [facility], [address] [place], [place] [address]          Radiology Studies: DG Abd 1 View  Result Date: 06/24/2022 CLINICAL DATA:  Encounter for orogastric (OG) tube placement [id number] EXAM: ABDOMEN - 1 VIEW COMPARISON:  06/23/2022 FINDINGS: A feeding tube is looped on itself in the stomach. The tip is positioned in the mid stomach directed towards the pylorus. Stable nonspecific bowel gas pattern. IMPRESSION: Feeding tube tip is positioned in the mid stomach. Electronically Signed   By: Kennith Center M.D.   On: 06/24/2022 07:41   DG Abd Portable 1V  Result Date: 06/23/2022 CLINICAL DATA:  Nasogastric tube placement EXAM: PORTABLE ABDOMEN - 1 VIEW COMPARISON:  CT 06/23/2022 FINDINGS: An enteric feeding tube has been placed. The tube is coiled in the left upper quadrant with tip projecting over the left upper quadrant. This is consistent with location in the body of the stomach. Visualized bowel gas pattern is normal. Lung bases are clear. IMPRESSION: Enteric feeding tube tip is in the left upper quadrant consistent with location in the body of the stomach. Electronically Signed   By: Burman Nieves M.D.   On: 06/23/2022 23:56   CT ABDOMEN PELVIS WO CONTRAST  Result Date: 06/23/2022 CLINICAL DATA:  Abdominal pain, acute, nonlocalized EXAM: CT ABDOMEN AND PELVIS WITHOUT CONTRAST TECHNIQUE: Multidetector CT imaging of the abdomen and pelvis was performed following the standard protocol without IV contrast. RADIATION DOSE REDUCTION: This exam was performed according to the departmental dose-optimization program which includes automated exposure control, adjustment of the mA and/or kV according to patient size and/or use of iterative reconstruction technique. COMPARISON:   None Available. FINDINGS: Lower chest: Mild bibasilar atelectasis. Moderate coronary artery calcification. Global cardiac size within normal limits. No acute abnormality. Hepatobiliary: No focal liver abnormality is seen. No gallstones, gallbladder wall thickening, or biliary dilatation. Pancreas: There is thickening of the pancreatic head with mild peripancreatic inflammatory stranding best appreciated surrounding the pancreatic head and within the pancreaticoduodenal groove in keeping with changes of acute pancreatitis. Viability of the pancreatic  parenchyma is not well assessed on this noncontrast examination. No peripancreatic fluid collections are identified. The pancreatic duct is not dilated. Spleen: Unremarkable Adrenals/Urinary Tract: The adrenal glands are unremarkable. Kidneys are normal on this noncontrast examination. Foley catheter balloon is seen within a decompressed bladder lumen. Stomach/Bowel: Moderate colonic stool burden without evidence of obstruction. Stomach, small bowel, and large bowel are otherwise unremarkable. Appendix normal. No free intraperitoneal gas or fluid. Vascular/Lymphatic: Aortic atherosclerosis. No enlarged abdominal or pelvic lymph nodes. Reproductive: Uterus and bilateral adnexa are unremarkable. Other: No abdominal wall hernia or abnormality. No abdominopelvic ascites. Musculoskeletal: Degenerative changes seen within the lumbar spine. No acute bone abnormality. No lytic or blastic bone lesion. IMPRESSION: 1. Acute pancreatitis. Pancreatic parenchymal viability is not well assessed on this noncontrast examination. No peripancreatic fluid collections identified. 2. Moderate coronary artery calcification. 3. Moderate colonic stool burden without evidence of obstruction. 4. Aortic atherosclerosis. Aortic Atherosclerosis (ICD10-I70.0). Electronically Signed   By: Helyn Numbers M.D.   On: 06/23/2022 21:39   CT HEAD WO CONTRAST ( )  Result Date: 06/23/2022 CLINICAL DATA:   Mental status change, unknown cause EXAM: CT HEAD WITHOUT CONTRAST TECHNIQUE: Contiguous axial images were obtained from the base of the skull through the vertex without intravenous contrast. RADIATION DOSE REDUCTION: This exam was performed according to the departmental dose-optimization program which includes automated exposure control, adjustment of the mA and/or kV according to patient size and/or use of iterative reconstruction technique. COMPARISON:  11/17/2010 FINDINGS: Brain: No acute intracranial abnormality. Specifically, no hemorrhage, hydrocephalus, mass lesion, acute infarction, or significant intracranial injury. Vascular: No hyperdense vessel or unexpected calcification. Skull: No acute calvarial abnormality. Sinuses/Orbits: No acute findings Other: None IMPRESSION: No acute intracranial abnormality. Electronically Signed   By: Charlett Nose M.D.   On: 06/23/2022 20:21        Scheduled Meds:  atorvastatin  20 mg Oral QHS   Chlorhexidine Gluconate Cloth  6 each Topical Daily   cloNIDine  0.3 mg Oral BID   enoxaparin (LOVENOX) injection  0.5 mg/kg Subcutaneous Q24H   hydrALAZINE  100 mg Oral TID   megestrol  40 mg Oral Daily   mirabegron ER  25 mg Oral Daily   montelukast  10 mg Oral QHS   mouth rinse  15 mL Mouth Rinse 4 times per day   pantoprazole  40 mg Oral Daily   Continuous Infusions:  dextrose 100 mL/hr at 06/24/22 0639   insulin 2.8 Units/hr (06/24/22 0800)     LOS: 1 day     Silvano Bilis, MD [facility]   If 7PM-7AM, please contact night-coverage [email or web address] Password [facility] 06/24/2022, 8:14 AM

## 2022-06-24 NOTE — Progress Notes (Signed)
PT Cancellation Note  Patient Details Name: Cynthia Ayers MRN: [id number] DOB: 11/06/1965   Cancelled Treatment:    Reason Eval/Treat Not Completed: Patient not medically ready. Patient with MEWS of 6 this am, will attempt PT evaluation this pm if patient appropriate.    Jasmia Angst 06/24/2022, 9:46 AM

## 2022-06-24 NOTE — Progress Notes (Signed)
NAME:  Cynthia Ayers, MRN:  [id number], DOB:  [birth year], LOS: 1 ADMISSION DATE:  06/23/2022, CONSULTATION DATE: 06/23/2022 REFERRING MD: Dr. Clyde Lundborg, CHIEF COMPLAINT: AMS   Brief Pt Description / Synopsis:  57 y.o. female admitted with Acute Metabolic Encephalopathy due to DKA in the setting of Acute Pancreatitis.  History of Present Illness:  This is a 57 yo female with a PMH of Type II Diabetes Mellitus who presented to [facility] ER via EMS on 04/23 with altered mental status.  Per ER notes EMS reported the pts coworkers called EMS when the pt did not show up to work today.  When EMS arrived at pts home she was found in bed confused.  EMS checked pts blood sugar and it was >500.  According to EMS the pt was able to ambulate 10 steps from her bed to the EMS chair.    ED Course  Upon arrival to the ER pt unable to follow commands, but she was able to move all extremities.  ER lab results were: Na+ 148/CO2 12/glucose 794/BUN 56/creatinine 2.24/anion gap 27/lactic acid 4.4/beta-hydroxy >8.00.  UA negative for UTI.  Pt ruled in for DKA, therefore insulin gtt initiated.  Pt admitted to the stepdown unit for additional workup and treatment per hospitalist team.  See detailed hospital course below for additional workup and treatment.   Pertinent  Medical History  Stage 3a CKD  HLD HTN Morbid Obesity  OSA (CPAP qhs) Type II Diabetes Mellitus   Significant Hospital Events: Including procedures, antibiotic start and stop dates in addition to other pertinent events   04/23: Pt admitted with acute metabolic encephalopathy and DKA requiring insulin gtt.  PCCM consulted due to worsening encephalopathy and to initiate precedex gtt 04/24: Precedex stopped overnight due to Bradycardia which has resolved. DKA resolved, Encephalopathy slowly improving. CT Abdomen consistent with Acute Pancreatitis (no peripancreatic fluid collections).  Plan to start trickle feeds via dobhoff and start free water flushes per tube for  hypernatremia.  PCCM to sign off.  Interim History / Subjective:  -Overnight became bradycardic with Precedex and CPAP ~ bradycardia resolved with discontinuation of Precedex ~ hypertensive this am -CT Abdomen consistent with Acute Pancreatitis (no peripancreatic fluid collections noted) -DKA resolved, but remains on insulin gtt as pt requiring D5W infusion for Hypernatremia  -Na improved to 157 from 159 on latest BMP results -Will plan to start free water flushes via dobhoff and also start trickle feeds -Afebrile, hemodynamically stable, no vasopressors -Mental status slowly improving ~ still too altered for PO intake at this time   Objective   Blood pressure (!) 155/96, pulse (!) 121, temperature 98.1 F (36.7 C), temperature source Oral, resp. rate (!) 36, height  (1.6 m), weight 113.9 kg, SpO2 100 %.    FiO2 (%):  [21 %] 21 %   Intake/Output Summary (Last 24 hours) at 06/24/2022 0726 Last data filed at 06/24/2022 0600 Gross per 24 hour  Intake 5994.27 ml  Output 1900 ml  Net 4094.27 ml    Filed Weights   06/23/22 1030 06/23/22 1415  Weight: 121.6 kg 113.9 kg    Examination: General: Acute on chronically-ill appearing female, laying in bed, on 2L [place], in NAD HENT: Supple, no JVD  Lungs: Diminished throughout, even, non labored  Cardiovascular: Sinus tachycardia, s1s2, no m/r/g, 2+ radial/2+ distal pulses, no edema  Abdomen: Hypoactive BS x4, obese, soft, tenderness to palpation in epigastric region Extremities: Normal bulk and tone, moves all extremities  Neuro: Lethargic, arouses easily to  voice, oriented only to self, following commands, no focal deficits noted, pupils PERRLA GU: Deferred   Resolved Hospital Problem list    Assessment & Plan:   #Diabetic ketoacidosis in the setting of Acute Pancreatitis ~ RESOLVED -CBG's q4h; Target range of 140 to 180 -Remains on insulin gtt for now due to hyperglycemia due to D5W infusion for Hypernatremia ~ plan to transition  to basal/bolus doing -Follow ICU Hypo/Hyperglycemia protocol -Diabetes coordinator following, appreciate input   #Acute Pancreatitis, ? Secondary to home Semaglutide #Nausea/Vomiting  #Abdominal pain  CT abdomen/pelvis 06/23/22:  Acute pancreatitis. Pancreatic parenchymal viability is not well assessed on this noncontrast examination. No peripancreatic fluid collections identified -No gallstones, gallbladder wall thickening or biliary dilatation noted on CT Abdomen; noted to have only mild Hypertriglyceridemia (259) -Trend pancreatic enzymes & hepatic function -Monitor fever curve -Trend WBC's & Procalcitonin ~ PCT is negative, will hold off on empiric ABX at this time -Pain management: Fentanyl PRN  -IV fluids -Plan to start trickle feeds via NGT -RUQ [place] pending to further evaluate gallbladder and biliary tree  #HTN~stable  #Bradycardia, due to Precedex ~ RESOLVED Hx: HLD  - Continuous telemetry monitoring  - Prn hydralazine for bp management  - Resume outpatient antihypertensives and statin once pt able to tolerate po's   #Acute kidney injury on CKD stage 3a (baseline Cr. Of around 1 ) #Hypernatremia  #Severe metabolic acidosis ~ RESOLVED #Lactic acidosis ~ RESOLVED -Monitor I&O's / urinary output -Follow BMP (serial Na+ q -Ensure adequate renal perfusion -Avoid nephrotoxic agents as able -Replace electrolytes as indicated -Continue D5W infusion to correct free water deficit ~ start free water flushes via Dobhoff  #OSA  #Asthma without acute exacerbation -Supplemental O2 as needed to maintain O2 sats >92% -CPAP qhs -Follow intermittent Chest X-ray & ABG as needed -Bronchodilators prn -Pulmonary toilet as able  #Acute metabolic encephalopathy in the setting of DKA & Pancreatitis ~ SLOWLY IMPROVING CT Head is negative for acute intracranial abnormality UDS is negative -Correction of metabolic derangements as outlined above -Provide supportive care - Avoid sedating  medications when able     Best Practice (right click and "Reselect all SmartList Selections" daily)   Diet/type: NPO, start trickle feeds DVT prophylaxis: LMWH GI prophylaxis: PPI Lines: N/A Foley:  N/A Code Status:  full code Last date of multidisciplinary goals of care discussion [4/24]  4/24: Pt's daughter updated at bedside regarding plan of care.  Labs   CBC: Recent Labs  Lab 06/23/22 1004 06/24/22 0338  WBC 10.1 12.7*  HGB 15.5* 14.7  HCT 49.7* 46.7*  MCV 86.3 85.7  PLT 246 202     Basic Metabolic Panel: Recent Labs  Lab 06/23/22 1249 06/23/22 1632 06/23/22 1818 06/23/22 2122 06/24/22 0338  NA 150* 157* 158* 159* 157*  K 4.5 3.6 3.5 3.7 3.8  CL 114* 121* 122* 124* 120*  CO2 10* 22 27 28 27   GLUCOSE 676* 345* 257* 211* 235*  BUN 57* 53* 54* 55* 55*  CREATININE 2.11* 1.76* 1.64* 1.67* 1.65*  CALCIUM 9.7 9.9 9.6 9.5 9.6  MG  --   --   --  3.3*  --   PHOS  --   --   --  2.1*  --     GFR: Estimated Creatinine Clearance: 46.8 mL/min (A) (by C-G formula based on SCr of 1.65 mg/dL (H)). Recent Labs  Lab 06/23/22 1004 06/23/22 1154 06/23/22 1818 06/23/22 2122 06/24/22 0338  PROCALCITON <0.10  --   --   --  <  0.10  WBC 10.1  --   --   --  12.7*  LATICACIDVEN 4.4* 3.5* 2.1* 1.8  --      Liver Function Tests: Recent Labs  Lab 06/23/22 1004 06/24/22 0338  AST 20 19  ALT 21 16  ALKPHOS 126 101  BILITOT 2.0* 1.2  PROT 8.9* 7.3  ALBUMIN 4.3 3.7    Recent Labs  Lab 06/23/22 1004 06/24/22 0338  LIPASE 101* 546*  AMYLASE  --  729*    No results for input(s): "AMMONIA" in the last 168 hours.  ABG    Component Value Date/Time   HCO3 31.9 (H) 06/24/2022 0201   ACIDBASEDEF 2.5 (H) 06/23/2022 1632   O2SAT 75.1 06/24/2022 0201     Coagulation Profile: No results for input(s): "INR", "PROTIME" in the last 168 hours.  Cardiac Enzymes: No results for input(s): "CKTOTAL", "CKMB", "CKMBINDEX", "TROPONINI" in the last 168 hours.  HbA1C: No  results found for: "HGBA1C"  CBG: Recent Labs  Lab 06/24/22 0257 06/24/22 0409 06/24/22 0508 06/24/22 0609 06/24/22 0704  GLUCAP 187* 223* 206* 200* 241*     Review of Systems:   Unable to assess pt confused   Past Medical History:  She,  has a past medical history of Chronic renal failure, stage 3a, HLD (hyperlipidemia), HTN (hypertension), Morbid obesity with BMI of 45.0-49.9, adult, OSA on CPAP, and Type II diabetes mellitus with renal manifestations.   Surgical History:     Social History:   reports that she has never smoked. She has never been exposed to tobacco smoke. She has never used smokeless tobacco. She reports that she does not currently use alcohol. She reports that she does not use drugs.   Family History:  Her family history includes Diabetes in her sister.   Allergies No Known Allergies   Home Medications  Prior to Admission medications   Medication Sig Start Date End Date Taking? Authorizing Provider  Armodafinil 150 MG tablet Take 150 mg by mouth daily. 05/15/22  Yes [provider]  atorvastatin (LIPITOR) 20 MG tablet Take 20 mg by mouth at bedtime. 05/06/22 05/06/23 Yes [provider]  cloNIDine (CATAPRES) 0.3 MG tablet Take 0.3 mg by mouth 2 (two) times daily. 04/28/22 04/28/23 Yes [provider]  hydrALAZINE (APRESOLINE) 100 MG tablet Take 1 tablet by mouth 3 (three) times daily. 12/24/21  Yes [provider]  magnesium oxide (MAG-OX) 400 (240 Mg) MG tablet Take 1 tablet by mouth 2 (two) times daily. 05/17/22  Yes [provider]  medroxyPROGESTERone (PROVERA) 10 MG tablet Take by mouth. 04/07/22  Yes [provider]  megestrol (MEGACE) 40 MG tablet Take by mouth. 04/28/22 04/28/23 Yes [provider]  meloxicam (MOBIC) 15 MG tablet Take by mouth. 12/09/21 12/09/22 Yes [provider]  mirabegron ER (MYRBETRIQ) 25 MG TB24 tablet Take 1 tablet by mouth daily. 06/18/22  Yes [provider]  montelukast (SINGULAIR) 10 MG tablet Take 1 tablet by mouth at bedtime. 04/28/22  Yes [provider]  ofloxacin (FLOXIN) 0.3 % OTIC solution SMARTSIG:5 Drop(s) Right Ear Twice Daily 05/18/22  Yes [provider]  pantoprazole (PROTONIX) 40 MG tablet Take by mouth. 04/28/22 04/28/23 Yes [provider]  potassium chloride (KLOR-CON M) 10 MEQ tablet Take by mouth. 04/28/22  Yes [provider]  predniSONE (DELTASONE) 10 MG tablet 3 tabs daily for 3 days, 2 tab daily for 3 days, 1 tab for 3 day 06/15/22  Yes [provider]  Semaglutide,  2 MG/DOSE, 8 MG/3ML SOPN Inject into the skin. 03/19/22  Yes [provider]  spironolactone (ALDACTONE) 50 MG tablet Take 1 tablet by mouth daily. 04/28/22 04/28/23 Yes [provider]  Armodafinil 250 MG tablet Take 250 mg by mouth daily. Patient not taking: Reported on 06/23/2022 05/29/22   [provider]  fluconazole (DIFLUCAN) 100 MG tablet Take 100 mg by mouth daily. Patient not taking: Reported on 06/23/2022 06/15/22   [provider]  ketorolac (TORADOL) 10 MG tablet Take 10 mg by mouth every 6 (six) hours as needed. Patient not taking: Reported on 06/23/2022 03/20/22   [provider]  [facility] 10 MG/0.5ML Pen Inject into the skin. Patient not taking: Reported on 06/23/2022 03/02/22   [provider]  traMADol (ULTRAM) 50 MG tablet Take by mouth. Patient not taking: Reported on 06/23/2022 05/22/22   [provider]     Critical care time: 40 minutes     Harlon Ditty, AGACNP-BC [facility] Prefer epic messenger for cross cover needs If after hours, please call E-link

## 2022-06-24 NOTE — Progress Notes (Signed)
*  PRELIMINARY RESULTS* Echocardiogram 2D Echocardiogram has been performed.  Carolyne Fiscal 06/24/2022, 3:39 PM

## 2022-06-24 NOTE — Progress Notes (Signed)
PHARMACY CONSULT NOTE - FOLLOW UP  Pharmacy Consult for Electrolyte Monitoring and Replacement   Recent Labs: Potassium (mmol/L)  Date Value  06/24/2022 3.6  12/19/2012 3.8   Magnesium (mg/dL)  Date Value  16/12/9602 3.3 (H)   Calcium (mg/dL)  Date Value  54/10/8117 9.4   Calcium, Total (mg/dL)  Date Value  14/78/2956 9.5   Albumin (g/dL)  Date Value  21/30/8657 3.7  12/19/2012 3.8   Phosphorus (mg/dL)  Date Value  84/69/6295 2.1 (L)   Sodium (mmol/L)  Date Value  06/24/2022 156 (H)  12/19/2012 137    Assessment: 57 year old female admitted with DKA and acute pancreatitis. PMH includes Stage 3a CKD, HLD, HTN, Morbid Obesity, OSA (CPAP qhs), Type II Diabetes Mellitus.  Anion gap closed. IV insulin transitioned off. CBG 170s at time of transitioning to subQ insulin  Goal of Therapy:  Electrolytes within normal limits  Plan:  --No replacement warranted this afternoon --Noted on free water every 6 hours and D5W /hr. Continue to monitor fluid status and Na.  --Follow up BMP, Mag, and phos tomorrow AM.   Elliot Gurney, PharmD, BCPS Clinical Pharmacist  06/24/2022 7:58 AM

## 2022-06-24 NOTE — Progress Notes (Signed)
Initial Nutrition Assessment  DOCUMENTATION CODES:   Morbid obesity  INTERVENTION:   Vital 1.5@60ml /hr- Initiate at 76ml/hr, once tolerating, increase by 22ml/hr q 8 hours until goal rate is reached.   ProSource TF 20- Give 60ml daily via tube, each supplement provides 80kcal and 20g of protein.   Free water flushes q4 hours   Regimen provides 2240kcal/day, 117g/day protein and 159ml/day of free water.  Pt at high refeed risk; recommend monitor potassium, magnesium and phosphorus labs daily until stable  Thiamine  IV daily   Daily weights   NUTRITION DIAGNOSIS:   Inadequate oral intake related to acute illness as evidenced by NPO status.  GOAL:   Patient will meet greater than or equal to 90% of their needs  MONITOR:   Diet advancement, Labs, Weight trends, TF tolerance, I & O's, Skin  REASON FOR ASSESSMENT:   Consult Assessment of nutrition requirement/status  ASSESSMENT:   57 y.o. female with medical history significant of DM, HTN, HLD, morbid obesity, OSA on CPAP, depression, anxiety and CKD 3A who is admitted with DKA, AKI and pancreatitis.  Visited pt's room today. Pt with AMS and is unable to provide any history. NGT in place with tip noted in the stomach. Moderate stool burden noted on CT. Plan is to initiate trickle tube feeds today. Pt is at high refeed risk; will add IV thiamine. Pt with hypernatremia; free water added. RD suspects pt with poor oral intake pta. Per chart, pt appears to be down ~31lbs(11%) over the past month if her admission weight is correct. RD suspects some weight loss is in r/t volume but likely still significant.   Medications reviewed and include:  lovenox, pepcid, insulin, megace, NaCl w/ KCl & dextrose /hr   Labs reviewed: Na 156(H), K 3.6 wnl, BUN 50(H), creat 1.67(H) P 2.1(L), Mg 3.3(H)- 4/23 Amylase 729(H), lipase 546(H) Wbc- 12.7(H) Cbgs- 176, 177, 155, 241, 200, 206, 223, 187, 191 x 24 hrs   NUTRITION -  FOCUSED PHYSICAL EXAM:  Flowsheet Row Most Recent Value  Orbital Region No depletion  Upper Arm Region No depletion  Thoracic and Lumbar Region No depletion  Buccal Region No depletion  Temple Region No depletion  Clavicle Bone Region No depletion  Clavicle and Acromion Bone Region No depletion  Scapular Bone Region No depletion  Dorsal Hand No depletion  Patellar Region No depletion  Anterior Thigh Region No depletion  Posterior Calf Region No depletion  Edema (RD Assessment) None  Hair Reviewed  Eyes Reviewed  Mouth Reviewed  Skin Reviewed  Nails Reviewed   Diet Order:   Diet Order             Diet NPO time specified Except for: Sips with Meds, Ice Chips  Diet effective now                  EDUCATION NEEDS:   Not appropriate for education at this time  Skin:  Skin Assessment: Reviewed RN Assessment  Last BM:  pta  Height:   Ht Readings from Last 1 Encounters:  06/23/22  (1.6 m)    Weight:   Wt Readings from Last 1 Encounters:  06/23/22 113.9 kg    Ideal Body Weight:  52.2 kg  BMI:  Body mass index is 44.48 kg/m.  Estimated Nutritional Needs:   Kcal:  2200-2500kcal/day  Protein:  110-125g/day  Fluid:  1.6-1.8L/day  Betsey Holiday MS, RD, LDN Please refer to [facility] for RD and/or RD on-call/weekend/after hours pager

## 2022-06-24 NOTE — Inpatient Diabetes Management (Addendum)
Inpatient Diabetes Program Recommendations  AACE/ADA: New Consensus Statement on Inpatient Glycemic Control  Target Ranges:  Prepandial:   less than 140 mg/dL      Peak postprandial:   less than 180 mg/dL (1-2 hours)      Critically ill patients:  140 - 180 mg/dL    Latest Reference Range & Units 06/24/22 00:49 06/24/22 01:50 06/24/22 02:57 06/24/22 04:09 06/24/22 05:08 06/24/22 06:09 06/24/22 07:04  Glucose-Capillary 70 - 99 mg/dL 161 (H) 096 (H) 045 (H) 223 (H) 206 (H) 200 (H) 241 (H)    Latest Reference Range & Units 06/23/22 10:04 06/24/22 03:38  CO2 22 - 32 mmol/L 12 (L) 27  Glucose 70 - 99 mg/dL 409 (HH) 811  Anion gap 5 - 15  27 (H) 10    Latest Reference Range & Units 06/23/22 10:04 06/23/22 23:54  Beta-Hydroxybutyric Acid 0.05 - 0.27 mmol/L >8.00 (H) 0.48   Review of Glycemic Control  Diabetes history: DM2 Outpatient Diabetes medications: Ozempic 2 mg Qweek Current orders for Inpatient glycemic control: IV insulin  Inpatient Diabetes Program Recommendations:    Insulin: Once provider is ready to transition from IV to SQ insulin, please consider ordering Semglee 14 units BID (based on 113.9 kg x 0.25 units), CBGs Q4H, and Novolog 0-15 units Q4H.  HbgA1C: Per Care Everywhere, last A1C 8.2% on 05/21/22.  Outpatient DM medication: Given pancreatitis, will likely need to discontinue Ozempic at discharge. May need to be discharged on insulin regimen.  NOTE: Patient admitted with DKA, acute metabolic encephalopathy, HTN, HLD, dehydration, acute renal failure, elevated lactic acid, abdominal pain, and pancreatitis.  Initial glucose 794 mg/dl on 11/13/76 and patient was started on IV insulin. In reviewing chart, noted patient seen Dayton Bailiff, PA on 06/15/22 and patient was prescribed Prednisone 10 mg tabs (3 tabs daily for 3 days, 2 tab daily for 3 days, 1 tab for 3 day). Anticipate recent steroids contributed to hyperglycemia noted on admission.  Will plan to speak to patient  today if appropriate.  Addendum 06/24/22@13 :05-Attempted to speak with patient at bedside. Patient's daughter is at bedside. Patient would try to open eyes to name and close them right back. Patient's daughter states patient has been asleep and not woken up good since she has been at bedside.She states that she does not know much about her mother's DM but her sister [name] may know more so she called her over her cell phone. Tiffany notes that their mother is taking Ozempic 2 mg Qweek for DM and she has not been checking glucose much. Tiffany notes that patient had been on insulin in the past and she had also used a CGM. Tiffany notes that patient's insurance would not pay for CGM since she was no longer on insulin. Tiffany is not sure how long patient has been off insulin. Inpatient diabetes team will plan to follow up with patient when more alert and able to engage in conversation.  Thanks, Orlando Penner, RN, MSN, CDCES Diabetes Coordinator Inpatient Diabetes Program [phone] (Team Pager from 8am to 5pm)

## 2022-06-25 ENCOUNTER — Inpatient Hospital Stay: Payer: 59

## 2022-06-25 DIAGNOSIS — E1110 Type 2 diabetes mellitus with ketoacidosis without coma: Secondary | ICD-10-CM | POA: Diagnosis not present

## 2022-06-25 LAB — GLUCOSE, CAPILLARY
Glucose-Capillary: 201 mg/dL — ABNORMAL HIGH (ref 70–99)
Glucose-Capillary: 231 mg/dL — ABNORMAL HIGH (ref 70–99)
Glucose-Capillary: 252 mg/dL — ABNORMAL HIGH (ref 70–99)
Glucose-Capillary: 263 mg/dL — ABNORMAL HIGH (ref 70–99)
Glucose-Capillary: 332 mg/dL — ABNORMAL HIGH (ref 70–99)
Glucose-Capillary: 376 mg/dL — ABNORMAL HIGH (ref 70–99)

## 2022-06-25 LAB — CBC
HCT: 43.9 % (ref 36.0–46.0)
Hemoglobin: 13.6 g/dL (ref 12.0–15.0)
MCH: 27 pg (ref 26.0–34.0)
MCHC: 31 g/dL (ref 30.0–36.0)
MCV: 87.3 fL (ref 80.0–100.0)
Platelets: 130 10*3/uL — ABNORMAL LOW (ref 150–400)
RBC: 5.03 MIL/uL (ref 3.87–5.11)
RDW: 14.6 % (ref 11.5–15.5)
WBC: 7.6 10*3/uL (ref 4.0–10.5)
nRBC: 0 % (ref 0.0–0.2)

## 2022-06-25 LAB — RENAL FUNCTION PANEL
Albumin: 3.2 g/dL — ABNORMAL LOW (ref 3.5–5.0)
Anion gap: 8 (ref 5–15)
BUN: 30 mg/dL — ABNORMAL HIGH (ref 6–20)
CO2: 25 mmol/L (ref 22–32)
Calcium: 8.8 mg/dL — ABNORMAL LOW (ref 8.9–10.3)
Chloride: 115 mmol/L — ABNORMAL HIGH (ref 98–111)
Creatinine, Ser: 1.24 mg/dL — ABNORMAL HIGH (ref 0.44–1.00)
GFR, Estimated: 51 mL/min — ABNORMAL LOW (ref >60)
Glucose, Bld: 345 mg/dL — ABNORMAL HIGH (ref 70–99)
Phosphorus: 1.9 mg/dL — ABNORMAL LOW (ref 2.5–4.6)
Potassium: 4.4 mmol/L (ref 3.5–5.1)
Sodium: 148 mmol/L — ABNORMAL HIGH (ref 135–145)

## 2022-06-25 LAB — MAGNESIUM: Magnesium: 2.4 mg/dL (ref 1.7–2.4)

## 2022-06-25 LAB — SODIUM: Sodium: 148 mmol/L — ABNORMAL HIGH (ref 135–145)

## 2022-06-25 LAB — OSMOLALITY: Osmolality: 348 mOsm/kg (ref 275–295)

## 2022-06-25 LAB — LIPASE, BLOOD: Lipase: 107 U/L — ABNORMAL HIGH (ref 11–51)

## 2022-06-25 LAB — HEMOGLOBIN A1C
Hgb A1c MFr Bld: 12.2 % — ABNORMAL HIGH (ref 4.8–5.6)
Mean Plasma Glucose: 303.44 mg/dL

## 2022-06-25 MED ORDER — VITAL 1.5 CAL PO LIQD: 1000.0000 mL | ORAL | Status: DC

## 2022-06-25 MED ORDER — LACTULOSE 10 GM/15ML PO SOLN
20.0000 g | Freq: Every day | ORAL | Status: DC
  Administered 2022-06-25 – 2022-06-26 (×2): 20 g
  Filled 2022-06-25 (×2): qty 30

## 2022-06-25 MED ORDER — SODIUM CHLORIDE 0.45 % IV SOLN: INTRAVENOUS | Status: DC

## 2022-06-25 MED ORDER — FENTANYL CITRATE PF 50 MCG/ML IJ SOSY
12.5000 ug | PREFILLED_SYRINGE | Freq: Once | INTRAMUSCULAR | Status: AC
  Administered 2022-06-25: 12.5 ug via INTRAVENOUS
  Filled 2022-06-25: qty 1

## 2022-06-25 MED ORDER — MORPHINE SULFATE (PF) 2 MG/ML IV SOLN
1.0000 mg | INTRAVENOUS | Status: DC | PRN
  Administered 2022-06-25: 1 mg via INTRAVENOUS
  Filled 2022-06-25 (×2): qty 1

## 2022-06-25 MED ORDER — FREE WATER
120.0000 mL | Status: DC
  Administered 2022-06-25 – 2022-06-26 (×4): 120 mL

## 2022-06-25 MED ORDER — PROCHLORPERAZINE EDISYLATE 10 MG/2ML IJ SOLN
10.0000 mg | Freq: Once | INTRAMUSCULAR | Status: AC
  Administered 2022-06-25: 10 mg via INTRAVENOUS
  Filled 2022-06-25: qty 2

## 2022-06-25 MED ORDER — VITAL 1.5 CAL PO LIQD
1000.0000 mL | ORAL | Status: DC
  Administered 2022-06-25: 1000 mL

## 2022-06-25 MED ORDER — FREE WATER
200.0000 mL | Freq: Four times a day (QID) | Status: DC
  Administered 2022-06-25: 200 mL

## 2022-06-25 MED ORDER — POTASSIUM & SODIUM PHOSPHATES 280-160-250 MG PO PACK
2.0000 | PACK | ORAL | Status: AC
  Administered 2022-06-25 (×3): 2
  Filled 2022-06-25 (×4): qty 2

## 2022-06-25 NOTE — [place] FL2 (Signed)
[place] MEDICAID FL2 LEVEL OF CARE FORM     IDENTIFICATION  Patient Name: Cynthia Ayers Birthdate: December 20, 1965 Sex: female Admission Date (Current Location): 06/23/2022  [facility] and [place] Number:  Chiropodist and Address:  [facility], [address] [place], [place] [address]      Provider Number: [id number]  Attending Physician Name and Address:  Kathrynn Running, MD  Relative Name and Phone Number:  Daughter Cynthia Ayers  [phone]    Current Level of Care: Hospital Recommended Level of Care: Skilled Nursing Facility Prior Approval Number:    Date Approved/Denied:   PASRR Number: [id number]  Discharge Plan: SNF    Current Diagnoses: Patient Active Problem List   Diagnosis Date Noted   DKA (diabetic ketoacidosis) 06/23/2022   Type II diabetes mellitus with renal manifestations 06/23/2022   HTN (hypertension) 06/23/2022   HLD (hyperlipidemia) 06/23/2022   OSA on CPAP 06/23/2022   Morbid obesity with BMI of 45.0-49.9, adult 06/23/2022   Acute metabolic encephalopathy 06/23/2022   Dehydration 06/23/2022   Acute renal failure superimposed on stage 3a chronic kidney disease 06/23/2022   Elevated lactic acid level 06/23/2022   Abdominal pain 06/23/2022   Acute kidney injury superimposed on chronic kidney disease 06/23/2022   Asthma, stable, mild persistent 06/04/2014    Orientation RESPIRATION BLADDER Height & Weight     Self  Normal Incontinent, External catheter Weight: 258 lb 6.1 oz (117.2 kg) Height:   (160 cm)  BEHAVIORAL SYMPTOMS/MOOD NEUROLOGICAL BOWEL NUTRITION STATUS      Continent Feeding tube  AMBULATORY STATUS COMMUNICATION OF NEEDS Skin   Extensive Assist Verbally Normal                       Personal Care Assistance Level of Assistance  Bathing, Feeding, Dressing, Total care Bathing Assistance: Limited assistance Feeding assistance: Limited assistance Dressing Assistance: Limited assistance Total  Care Assistance: Maximum assistance   Functional Limitations Info  Sight, Hearing, Speech Sight Info: Adequate Hearing Info: Adequate Speech Info: Adequate    SPECIAL CARE FACTORS FREQUENCY  PT (By licensed PT), OT (By licensed OT)     PT Frequency: min 4x weekly OT Frequency: min 4x weekly            Contractures Contractures Info: Not present    Additional Factors Info  Code Status, Allergies Code Status Info: full Allergies Info: NKA           Current Medications (06/25/2022):  This is the current hospital active medication list Current Facility-Administered Medications  Medication Dose Route Frequency Provider Last Rate Last Admin   0.45 % sodium chloride infusion   Intravenous Continuous Kathrynn Running, MD 125 mL/hr at 06/25/22 0858 New Bag at 06/25/22 0858   acetaminophen (TYLENOL) suppository 650 mg  650 mg Rectal Q6H PRN Lorretta Harp, MD       acetaminophen (TYLENOL) tablet 650 mg  650 mg Per Tube Q6H PRN Wouk, Wilfred Curtis, MD       atorvastatin (LIPITOR) tablet 20 mg  20 mg Per Tube QHS Wouk, Wilfred Curtis, MD   20 mg at 06/24/22 2133   Chlorhexidine Gluconate Cloth 2 % PADS 6 each  6 each Topical Daily Lorretta Harp, MD   6 each at 06/25/22 1029   cloNIDine (CATAPRES) tablet 0.3 mg  0.3 mg Per Tube BID Kathrynn Running, MD   0.3 mg at 06/25/22 1000   dextrose 50 % solution 0-50 mL  0-50  mL Intravenous PRN Minna Antis, MD       enoxaparin (LOVENOX) injection 60 mg  0.5 mg/kg Subcutaneous Q24H Lorretta Harp, MD   60 mg at 06/24/22 2132   famotidine (PEPCID) tablet 20 mg  20 mg Per Tube Daily Drusilla Kanner, RPH   20 mg at 06/25/22 1000   feeding supplement (VITAL 1.5 CAL) liquid 1,000 mL  1,000 mL Per Tube Q24H Wouk, Wilfred Curtis, MD   Infusion Verify at 06/25/22 0800   folic acid injection 1 mg  1 mg Intravenous Daily Jaynie Bream, RPH   1 mg at 06/25/22 1034   free water 200 mL  200 mL Per Tube Q6H Wouk, Wilfred Curtis, MD       hydrALAZINE (APRESOLINE)  injection 5 mg  5 mg Intravenous Q2H PRN Lorretta Harp, MD       hydrALAZINE (APRESOLINE) tablet 100 mg  100 mg Per Tube TID Kathrynn Running, MD   100 mg at 06/25/22 0959   insulin aspart (novoLOG) injection 0-15 Units  0-15 Units Subcutaneous Q4H Kathrynn Running, MD   11 Units at 06/25/22 0757   insulin glargine-yfgn (SEMGLEE) injection 14 Units  14 Units Subcutaneous BID Kathrynn Running, MD   14 Units at 06/25/22 1105   LORazepam (ATIVAN) tablet 1-4 mg  1-4 mg Oral Q1H PRN Jaynie Bream, RPH       Or   LORazepam (ATIVAN) injection 1-4 mg  1-4 mg Intravenous Q1H PRN Jaynie Bream, RPH       megestrol (MEGACE) tablet 40 mg  40 mg Per Tube Daily Wouk, Wilfred Curtis, MD   40 mg at 06/25/22 1030   montelukast (SINGULAIR) tablet 10 mg  10 mg Per Tube QHS Kathrynn Running, MD   10 mg at 06/24/22 2132   multivitamin with minerals tablet 1 tablet  1 tablet Per Tube Daily Jaynie Bream, RPH   1 tablet at 06/25/22 1000   ondansetron (ZOFRAN) injection 4 mg  4 mg Intravenous Q8H PRN Lorretta Harp, MD   4 mg at 06/25/22 0249   Oral care mouth rinse  15 mL Mouth Rinse 4 times per day Lorretta Harp, MD   15 mL at 06/25/22 0757   Oral care mouth rinse  15 mL Mouth Rinse PRN Lorretta Harp, MD       potassium & sodium phosphates (PHOS-NAK) 280-160-250 MG packet 2 packet  2 packet Per Tube Q4H Jaynie Bream, RPH   2 packet at 06/25/22 1034   thiamine (VITAMIN B1) injection 100 mg  100 mg Intravenous Daily Kathrynn Running, MD   100 mg at 06/25/22 1029     Discharge Medications: Please see discharge summary for a list of discharge medications.  Relevant Imaging Results:  Relevant Lab Results:   Additional Information SSN:[id number]  Darolyn Rua, LCSW

## 2022-06-25 NOTE — TOC Initial Note (Signed)
Transition of Care [facility]) - Initial/Assessment Note    Patient Details  Name: Cynthia Ayers MRN: [id number] Date of Birth: 1965/03/04  [facility] CM/SW Contact:    Darolyn Rua, LCSW Phone Number: 06/25/2022, 11:58 AM  Clinical Narrative:                  CSW notes patient lethargic and disoriented, CSW called patient's daughter [name] who reports she is coming to see patent tonight after work.  She is aware of patient's altered mental status, reports she would like to speak with patient tonight about SnF recs to see if she's more oriented to make a decision before csw sends out referrals. She reports she will call tomorrow with their decision.   Expected Discharge Plan: Skilled Nursing Facility Barriers to Discharge: Continued Medical Work up   Patient Goals and CMS Choice Patient states their goals for this hospitalization and ongoing recovery are:: to go home CMS Medicare.gov Compare Post Acute Care list provided to:: Patient Represenative (must comment) (daughter [name])        Expected Discharge Plan and Services       Living arrangements for the past 2 months: Single Family Home                                      Prior Living Arrangements/Services Living arrangements for the past 2 months: Single Family Home Lives with:: Relatives                   Activities of Daily Living Home Assistive Devices/Equipment: CBG Meter ADL Screening (condition at time of admission) Patient's cognitive ability adequate to safely complete daily activities?: Yes Is the patient deaf or have difficulty hearing?: No Does the patient have difficulty seeing, even when wearing glasses/contacts?: No Does the patient have difficulty concentrating, remembering, or making decisions?: Yes Patient able to express need for assistance with ADLs?: No Does the patient have difficulty dressing or bathing?: Yes Independently performs ADLs?: No Communication:  Independent Dressing (OT): Needs assistance Is this a change from baseline?: Change from baseline, expected to last <3days Grooming: Needs assistance Is this a change from baseline?: Change from baseline, expected to last <3 days Feeding: Needs assistance Is this a change from baseline?: Change from baseline, expected to last <3 days Bathing: Needs assistance Is this a change from baseline?: Change from baseline, expected to last <3 days Toileting: Needs assistance Is this a change from baseline?: Change from baseline, expected to last <3 days In/Out Bed: Dependent Is this a change from baseline?: Change from baseline, expected to last <3 days Walks in Home: Dependent Is this a change from baseline?: Change from baseline, expected to last <3 days Does the patient have difficulty walking or climbing stairs?: Yes Weakness of Legs: Both Weakness of Arms/Hands: Both  Permission Sought/Granted                  Emotional Assessment              Admission diagnosis:  DKA (diabetic ketoacidosis) [E11.10] Patient Active Problem List   Diagnosis Date Noted   DKA (diabetic ketoacidosis) 06/23/2022   Type II diabetes mellitus with renal manifestations 06/23/2022   HTN (hypertension) 06/23/2022   HLD (hyperlipidemia) 06/23/2022   OSA on CPAP 06/23/2022   Morbid obesity with BMI of 45.0-49.9, adult 06/23/2022   Acute metabolic encephalopathy 06/23/2022  Dehydration 06/23/2022   Acute renal failure superimposed on stage 3a chronic kidney disease 06/23/2022   Elevated lactic acid level 06/23/2022   Abdominal pain 06/23/2022   Acute kidney injury superimposed on chronic kidney disease 06/23/2022   Asthma, stable, mild persistent 06/04/2014   PCP:  Alm Bustard, NP Pharmacy:   [facility] [address], [place] - [address] [place] [address] Phone: [phone] Fax: [phone]     Social Determinants of Health (SDOH) Social  History: SDOH Screenings   Tobacco Use: Low Risk  (06/23/2022)   SDOH Interventions:     Readmission Risk Interventions     No data to display

## 2022-06-25 NOTE — Evaluation (Signed)
Occupational Therapy Evaluation Patient Details Name: Cynthia Ayers MRN: [id number] DOB: Dec 26, 1965 Today's Date: 06/25/2022   History of Present Illness Cynthia Ayers is a 57 y.o. female with medical history significant of DM, HTN, HLD, morbid obesity, OSA on CPAP, depression with anxiety, CKD 3A, who presents with altered mental status. pts coworkers called EMS when the pt did not show up to work today.  When EMS arrived at pts home she was found in bed confused.They checked a blood sugar which was greater than 500. Per EDP, initially patient could not answer questions or follow commands.  Patient was given IV fluid in ED, and started on insulin drip.   Clinical Impression   Patient presenting with decreased Ind in self care, balance, functional mobility/transfers, endurance, and safety awareness.Patient's daughter present to confirm baseline and reports pt is Ind at baseline, lives alone, and works at [employer]. Pt is lethargic and needing increased time to process but overall motivated to participate with therapy evaluation. Pt performs bed mobility and sit <>stand with +2 assistance. She was able to take small shuffling side steps towards the L but very fatigued afterwards. Patient will benefit from acute OT to increase overall independence in the areas of ADLs, functional mobility, and safety awareness in order to safely.     Recommendations for follow up therapy are one component of a multi-disciplinary discharge planning process, led by the attending physician.  Recommendations may be updated based on patient status, additional functional criteria and insurance authorization.   Assistance Recommended at Discharge Frequent or constant Supervision/Assistance  Patient can return home with the following A lot of help with walking and/or transfers;A lot of help with bathing/dressing/bathroom;Assistance with cooking/housework;Assist for transportation;Help with stairs or ramp for entrance;Direct  supervision/assist for financial management;Direct supervision/assist for medications management    Functional Status Assessment  Patient has had a recent decline in their functional status and demonstrates the ability to make significant improvements in function in a reasonable and predictable amount of time.  Equipment Recommendations  Other (comment) (defer to next venue of care)       Precautions / Restrictions Precautions Precautions: Fall Restrictions Weight Bearing Restrictions: No      Mobility Bed Mobility Overal bed mobility: Needs Assistance Bed Mobility: Supine to Sit, Sit to Supine     Supine to sit: Min assist, +2 for physical assistance, +2 for safety/equipment, HOB elevated Sit to supine: Min assist, +2 for physical assistance, +2 for safety/equipment        Transfers Overall transfer level: Needs assistance Equipment used: 2 person hand held assist Transfers: Sit to/from Stand Sit to Stand: Min assist, +2 physical assistance, +2 safety/equipment                  Balance Overall balance assessment: Needs assistance Sitting-balance support: Feet supported Sitting balance-Leahy Scale: Fair     Standing balance support: Bilateral upper extremity supported, During functional activity Standing balance-Leahy Scale: Poor                             ADL either performed or assessed with clinical judgement   ADL Overall ADL's : Needs assistance/impaired     Grooming: Wash/dry hands;Wash/dry face;Bed level;Set up                                 General ADL Comments: max A to don B socks  Vision Patient Visual Report: No change from baseline              Pertinent Vitals/Pain Pain Assessment Pain Assessment: Faces Faces Pain Scale: Hurts little more Pain Location: abdomen Pain Descriptors / Indicators: Discomfort, Grimacing Pain Intervention(s): Patient requesting pain meds-RN notified, Monitored during session      Hand Dominance Right   Extremity/Trunk Assessment Upper Extremity Assessment Upper Extremity Assessment: Generalized weakness   Lower Extremity Assessment Lower Extremity Assessment: Generalized weakness   Cervical / Trunk Assessment Cervical / Trunk Assessment: Normal   Communication Communication Communication: Expressive difficulties;Other (comment)   Cognition Arousal/Alertness: Lethargic Behavior During Therapy: WFL for tasks assessed/performed Overall Cognitive Status: Difficult to assess                                 General Comments: due to lethargy, she is not oriented to situation but oriented to self. She needs increased time to process and follow simple 1 step commands                Home Living Family/patient expects to be discharged to:: Private residence Living Arrangements: Alone Available Help at Discharge: Family;Available PRN/intermittently Type of Home: Apartment Home Access: Level entry     Home Layout: One level     Bathroom Shower/Tub: Chief Strategy Officer: Standard     Home Equipment: None          Prior Functioning/Environment Prior Level of Function : Independent/Modified Independent;Working/employed;Driving                        OT Problem List: Decreased strength;Decreased range of motion;Decreased cognition;Decreased activity tolerance;Decreased safety awareness;Impaired balance (sitting and/or standing);Decreased knowledge of use of DME or AE      OT Treatment/Interventions: Self-care/ADL training;Therapeutic exercise;Therapeutic activities;Energy conservation;DME and/or AE instruction;Patient/family education;Balance training    OT Goals(Current goals can be found in the care plan section) Acute Rehab OT Goals Patient Stated Goal: to get stronger and go home OT Goal Formulation: With patient Time For Goal Achievement: 07/09/22 Potential to Achieve Goals: Fair ADL Goals Pt Will  Perform Grooming: with supervision Pt Will Perform Lower Body Dressing: sit to/from stand;with min assist Pt Will Transfer to Toilet: with min assist;ambulating Pt Will Perform Toileting - Clothing Manipulation and hygiene: with min guard assist;sit to/from stand  OT Frequency: Min 2X/week    Co-evaluation PT/OT/SLP Co-Evaluation/Treatment: Yes Reason for Co-Treatment: For patient/therapist safety;To address functional/ADL transfers;Complexity of the patient's impairments (multi-system involvement) PT goals addressed during session: Mobility/safety with mobility OT goals addressed during session: ADL's and self-care      AM-PAC OT "6 Clicks" Daily Activity     Outcome Measure Help from another person eating meals?: None Help from another person taking care of personal grooming?: A Little Help from another person toileting, which includes using toliet, bedpan, or urinal?: A Lot Help from another person bathing (including washing, rinsing, drying)?: A Lot Help from another person to put on and taking off regular upper body clothing?: A Lot Help from another person to put on and taking off regular lower body clothing?: A Lot 6 Click Score: 15   End of Session Nurse Communication: Mobility status;Patient requests pain meds  Activity Tolerance: Patient limited by lethargy;Patient limited by fatigue Patient left: in bed  OT Visit Diagnosis: Unsteadiness on feet (R26.81);Repeated falls (R29.6);Muscle weakness (generalized) (M62.81)  Time: 1610-9604 OT Time Calculation (min): 17 min Charges:  OT General Charges $OT Visit: 1 Visit OT Evaluation $OT Eval Moderate Complexity: [address], [place], OTR/L , CBIS ascom [phone]  06/25/22, 1:25 PM

## 2022-06-25 NOTE — Evaluation (Signed)
Physical Therapy Evaluation Patient Details Name: Cynthia Ayers MRN: [id number] DOB: June 29, 1965 Today's Date: 06/25/2022  History of Present Illness  Cynthia Ayers is a 57 y.o. female with medical history significant of DM, HTN, HLD, morbid obesity, OSA on CPAP, depression with anxiety, CKD 3A, who presents with altered mental status. pts coworkers called EMS when the pt did not show up to work today.  When EMS arrived at pts home she was found in bed confused.They checked a blood sugar which was greater than 500. Per EDP, initially patient could not answer questions or follow commands.  Patient was given IV fluid in ED, and started on insulin drip.  Clinical Impression  Patient received in bed, she is lethargic, but responsive with head nods. Patient requires min A +2 for bed mobility, Min A +2 for transfers and taking a few side steps at edge of bed. Patient is limited by HR (up to 134 with minimal activity) and lethargy. She will continue to benefit from skilled PT to improve strength and functional independence.          Recommendations for follow up therapy are one component of a multi-disciplinary discharge planning process, led by the attending physician.  Recommendations may be updated based on patient status, additional functional criteria and insurance authorization.  Follow Up Recommendations Can patient physically be transported by private vehicle: No     Assistance Recommended at Discharge Frequent or constant Supervision/Assistance  Patient can return home with the following  A lot of help with walking and/or transfers;A lot of help with bathing/dressing/bathroom;Help with stairs or ramp for entrance;Assist for transportation;Assistance with cooking/housework    Equipment Recommendations Rolling walker (2 wheels)  Recommendations for Other Services       Functional Status Assessment Patient has had a recent decline in their functional status and demonstrates the ability to make  significant improvements in function in a reasonable and predictable amount of time.     Precautions / Restrictions Precautions Precautions: Fall Restrictions Weight Bearing Restrictions: No      Mobility  Bed Mobility Overal bed mobility: Needs Assistance Bed Mobility: Supine to Sit, Sit to Supine     Supine to sit: Min assist, +2 for physical assistance, +2 for safety/equipment, HOB elevated Sit to supine: Min assist, +2 for physical assistance, +2 for safety/equipment        Transfers Overall transfer level: Needs assistance Equipment used: 2 person hand held assist Transfers: Sit to/from Stand Sit to Stand: Min assist, +2 physical assistance, +2 safety/equipment                Ambulation/Gait Ambulation/Gait assistance: Min assist, +2 physical assistance, +2 safety/equipment Gait Distance (Feet): 3 Feet Assistive device: 2 person hand held assist Gait Pattern/deviations: Step-to pattern Gait velocity: decr     General Gait Details: patient able to take a few side steps at edge of bed  Stairs            Wheelchair Mobility    Modified Rankin (Stroke Patients Only)       Balance Overall balance assessment: Needs assistance Sitting-balance support: Feet supported Sitting balance-Leahy Scale: Fair     Standing balance support: Bilateral upper extremity supported, During functional activity Standing balance-Leahy Scale: Fair                               Pertinent Vitals/Pain Pain Assessment Pain Assessment: Faces Faces Pain Scale: Hurts little more Pain Location:  abdomen Pain Descriptors / Indicators: Discomfort, Grimacing Pain Intervention(s): Patient requesting pain meds-RN notified, Repositioned    Home Living Family/patient expects to be discharged to:: Private residence Living Arrangements: Alone Available Help at Discharge: Family;Available PRN/intermittently Type of Home: Apartment Home Access: Level entry        Home Layout: One level Home Equipment: None      Prior Function Prior Level of Function : Independent/Modified Independent;Working/employed;Driving                     Hand Dominance        Extremity/Trunk Assessment   Upper Extremity Assessment Upper Extremity Assessment: Defer to OT evaluation    Lower Extremity Assessment Lower Extremity Assessment: Generalized weakness    Cervical / Trunk Assessment Cervical / Trunk Assessment: Normal  Communication   Communication: Expressive difficulties;Other (comment) (due tho lethargy)  Cognition Arousal/Alertness: Lethargic Behavior During Therapy: WFL for tasks assessed/performed Overall Cognitive Status: Difficult to assess                                 General Comments: due to lethargy, she is not oriented to situation        General Comments      Exercises     Assessment/Plan    PT Assessment Patient needs continued PT services  PT Problem List Decreased strength;Decreased activity tolerance;Decreased balance;Decreased mobility;Decreased cognition;Decreased safety awareness;Decreased knowledge of use of DME;Obesity;Pain       PT Treatment Interventions DME instruction;Gait training;Stair training;Functional mobility training;Therapeutic activities;Patient/family education;Balance training;Therapeutic exercise;Neuromuscular re-education    PT Goals (Current goals can be found in the Care Plan section)  Acute Rehab PT Goals Patient Stated Goal: to improve, return home PT Goal Formulation: With patient/family Time For Goal Achievement: 07/09/22 Potential to Achieve Goals: Good    Frequency Min 4X/week     Co-evaluation               AM-PAC PT "6 Clicks" Mobility  Outcome Measure Help needed turning from your back to your side while in a flat bed without using bedrails?: A Lot Help needed moving from lying on your back to sitting on the side of a flat bed without using  bedrails?: A Lot Help needed moving to and from a bed to a chair (including a wheelchair)?: A Lot Help needed standing up from a chair using your arms (e.g., wheelchair or bedside chair)?: A Lot Help needed to walk in hospital room?: A Lot Help needed climbing 3-5 steps with a railing? : Total 6 Click Score: 11    End of Session   Activity Tolerance: Patient limited by fatigue;Patient limited by lethargy Patient left: in bed;with call bell/phone within reach;with bed alarm set;with family/visitor present Nurse Communication: Mobility status PT Visit Diagnosis: Other abnormalities of gait and mobility (R26.89);Muscle weakness (generalized) (M62.81);Difficulty in walking, not elsewhere classified (R26.2);Pain Pain - part of body:  (abdomen)    Time: 1610-9604 PT Time Calculation (min) (ACUTE ONLY): 18 min   Charges:   PT Evaluation $PT Eval Moderate Complexity: 1 Mod          Charda Janis, PT, GCS 06/25/22,11:03 AM

## 2022-06-25 NOTE — Progress Notes (Signed)
       CROSS COVER NOTE  NAME: Roselin Wiemann MRN: [id number] DOB : 12-07-1965    HPI/Events of Note   Report:patient with abdominal pain 10/10 and reports felt short of breath after receiving morphine and reported the fentanyl had worked well for her  On review of chart:admitted DKA, alcohol withdrawal, encephalopathic    Assessment and  Interventions   Assessment:  Plan: One time dose fentanyl 12.5 mcg Morphine reaction listed in allergies       Donnie Mesa NP [facility]

## 2022-06-25 NOTE — Inpatient Diabetes Management (Addendum)
Inpatient Diabetes Program Recommendations  AACE/ADA: New Consensus Statement on Inpatient Glycemic Control (2015)  Target Ranges:  Prepandial:   less than 140 mg/dL      Peak postprandial:   less than 180 mg/dL (1-2 hours)      Critically ill patients:  140 - 180 mg/dL   Lab Results  Component Value Date   GLUCAP 332 (H) 06/25/2022    Review of Glycemic Control  Latest Reference Range & Units 06/24/22 19:40 06/24/22 23:35 06/25/22 03:33 06/25/22 07:37  Glucose-Capillary 70 - 99 mg/dL 161 (H) 096 (H) 045 (H) 332 (H)  (H): Data is abnormally high  Diabetes history: DM2 Outpatient Diabetes medications: Ozempic weekly Current orders for Inpatient glycemic control: Semglee 14 units BID, Novolog 0-15 Q4H, Vital 1.5 @ 20 ml/hr  Inpatient Diabetes Program Recommendations:    Might consider:  Semglee 18 BID Novolog 3 units Q4H tube feed coverage; hold if feeds held or discontinued  Will continue to follow while inpatient.  Thank you, Dulce Sellar, MSN, CDCES Diabetes Coordinator Inpatient Diabetes Program [phone] (team pager from 8a-5p)

## 2022-06-25 NOTE — Progress Notes (Signed)
Nutrition Follow Up Note   DOCUMENTATION CODES:   Morbid obesity  INTERVENTION:   Vital 1.5@60ml /hr- Initiate at 68ml/hr, once tolerating, increase by 78ml/hr q 8 hours until goal rate is reached.   ProSource TF 20- Give 60ml daily via tube, each supplement provides 80kcal and 20g of protein.   Free water flushes q4 hours   Regimen provides 2240kcal/day, 117g/day protein and 1869ml/day of free water.  Pt at high refeed risk; recommend monitor potassium, magnesium and phosphorus labs daily until stable  Thiamine  IV daily   Daily weights   NUTRITION DIAGNOSIS:   Inadequate oral intake related to acute illness as evidenced by NPO status.  GOAL:   Patient will meet greater than or equal to 90% of their needs -not met   MONITOR:   Diet advancement, Labs, Weight trends, TF tolerance, I & O's, Skin  ASSESSMENT:   57 y.o. female with medical history significant of DM, HTN, HLD, morbid obesity, OSA on CPAP, depression, anxiety and CKD 3A who is admitted with DKA, AKI and pancreatitis.  Pt with nausea and vomiting noted after initiation of tube feeds yesterday. Tube feeds held overnight. No more vomiting noted today. Pt is a little more alert today. NGT advanced post-pyloric by fluoroscopy. Will plan to resume trickle feeds and advance as tolerated. Pt is refeeding and remains at refeed risk. Hypernatremia improved. No BM yet; pt noted to have moderate to large stool burden on KUB.   Medications reviewed and include:  lovenox, pepcid, folic acid, insulin, megace, Kphos, thiamine, NaCl /hr  Labs reviewed: Na 148(H), K 4.4 wnl, BUN 30(H), creat 1.24(H), P 1.9(L), Mg 2.4 wnl lipase 107(H) Wbc- 12.7(H) Cbgs- 201, 252, 332, 376 x 24 hrs   Diet Order:   Diet Order             Diet NPO time specified Except for: Sips with Meds, Ice Chips  Diet effective now                  EDUCATION NEEDS:   Not appropriate for education at this time  Skin:  Skin  Assessment: Reviewed RN Assessment  Last BM:  pta  Height:   Ht Readings from Last 1 Encounters:  06/23/22  (1.6 m)    Weight:   Wt Readings from Last 1 Encounters:  06/25/22 117.2 kg    Ideal Body Weight:  52.2 kg  BMI:  Body mass index is 45.77 kg/m.  Estimated Nutritional Needs:   Kcal:  2200-2500kcal/day  Protein:  110-125g/day  Fluid:  1.6-1.8L/day  Betsey Holiday MS, RD, LDN Please refer to [facility] for RD and/or RD on-call/weekend/after hours pager

## 2022-06-25 NOTE — Progress Notes (Signed)
PROGRESS NOTE    Cynthia Ayers  ZOX:[id number] DOB: 1965-08-12 DOA: 06/23/2022 PCP: Alm Bustard, NP     Brief Narrative:   From admission h and p  Cynthia Ayers is a 57 y.o. female with medical history significant of DM, HTN, HLD, morbid obesity, OSA on CPAP, depression with anxiety, CKD 3A, who presents with altered mental status.   Patient has AMS, can only provide limited medical history.  I tried to call her daughter without success, her daughter's cell phone is not set up for leaving a message. Therefore, most of the history is obtained by discussing the case with ED physician, per EMS report, and with the nursing staff.    Per report, pts coworkers called EMS when the pt did not show up to work today.  When EMS arrived at pts home she was found in bed confused.They checked a blood sugar which was greater than 500. Per EDP, initially patient could not answer questions or follow commands.  Patient was given IV fluid in ED, and started on insulin drip.  Her mental status improved slightly.  When I saw patient in ED, patient is confused about year 2024.  She knows her own name, knows that she is in the hospital.  She denies chest pain.  No active respiratory distress or cough noted.  She complains of abdominal pain, but cannot provide detailed information.  No active nausea, vomiting or diarrhea noted.  She moves all extremities. Later on, after pt is transferred to SDU, she becomes very agitated, not following command.   Later day of admission transferred to ICU with severe encephalopathy. Trialed precedex but developed bradycardia  Assessment & Plan:   Principal Problem:   DKA (diabetic ketoacidosis) Active Problems:   Type II diabetes mellitus with renal manifestations   Acute metabolic encephalopathy   HTN (hypertension)   HLD (hyperlipidemia)   Dehydration   Acute renal failure superimposed on stage 3a chronic kidney disease   Elevated lactic acid level   Abdominal pain    OSA on CPAP   Morbid obesity with BMI of 45.0-49.9, adult  # DKA # T2DM Pancreatitis likely contributory. Recent steroids also likely contributory. A1c 8.2. gap closed, last beta-h was close to normal. Now off insulin gtt. Glucose remains elevated, in the setting of d5 - stop d5 infusion, start 0.45 ns - continue semglee and SSI q4  # Acute metabolic encephalopathy Likely 2/2 dka, pancreatitis. Appears to be slowly. CT head neg. UDS unremarkable - monitor  # Debility PT advising SNF - TOC consulted  # Bradycardia With precedex, resolved. TTE unremarkable - monitor  # Acute pancreatitis Mild elevation of pancreatic enzymes, non-con CT showing pancreatitis. Triglycerides not significantly elevated. T bili is mildly elevated. No stones seen on CT nor stones or biliary duct dilitation seen on RUQ u/s.  Home semaglutide may have caused, would hold at d/c - continue fluids, anti-emetic - IR to advance NG tube today to start trickle feeds, will advance diet as mental status allows  # Hypernatremia Likely 2/2 dehydration. Corrected sodium of 152 today was 158 yesterday - continue fluids but switch from d5 to 0.45% na, and will increase volume of free water flushes from 100 to 200 ml  # HTN Wnl today -  cont home clonidine and hydral - home spiro on hold  # Thrombocytopenia New on today's labs, 130 - monitor, further w/u if persists/worsens  # AKI Baseline cr of around 1, presented with 2.25, improving with fluids, today 1.24 -  continue fluids and monitor  # Narcolepsy? - resume home armodafinil when more lucid  # Asthma - dulera for home advair  # Acute urinary retention Foley has been placed - will plan on trial of void when more lucid - home oxybutynin and mirabegron on hold  # OSA - cpap qhs  # obesity Noted    DVT prophylaxis: lovenox Code Status: full Family Communication: daughter updated telephonically 4/25  Level of care: Stepdown Status is:  Inpatient Remains inpatient appropriate because: severity of illness    Consultants:  pccm  Procedures: none  Antimicrobials:  none    Subjective: Somnolent, no pain  Objective: Vitals:   06/25/22 0635 06/25/22 0700 06/25/22 0758 06/25/22 0800  BP: (!) 152/72 (!) 159/88 131/65 131/65  Pulse: 78 80 (!) 107 96  Resp: 20 (!) 39 (!) 24 20  Temp:   99 F (37.2 C)   TempSrc:   Axillary   SpO2: 96% 96% 96% 95%  Weight:      Height:        Intake/Output Summary (Last 24 hours) at 06/25/2022 1125 Last data filed at 06/25/2022 0800 Gross per 24 hour  Intake 3109.51 ml  Output 1245 ml  Net 1864.51 ml   Filed Weights   06/23/22 1030 06/23/22 1415 06/25/22 0500  Weight: 121.6 kg 113.9 kg 117.2 kg    Examination:  General exam: appears ill  Respiratory system: Clear to auscultation. Respiratory effort normal. Cardiovascular system: tachycardic Gastrointestinal system: Abdomen is obese, soft and nontender. No organomegaly or masses felt. Normal bowel sounds heard. Central nervous system: awake but somnolent, moving all 4 Extremities: Symmetric 5 x 5 power. Trace LE edema Skin: No rashes, lesions or ulcers Psychiatry: confused    Data Reviewed: I have personally reviewed following labs and imaging studies  CBC: Recent Labs  Lab 06/23/22 1004 06/24/22 0338 06/25/22 0928  WBC 10.1 12.7* 7.6  HGB 15.5* 14.7 13.6  HCT 49.7* 46.7* 43.9  MCV 86.3 85.7 87.3  PLT 246 202 130*   Basic Metabolic Panel: Recent Labs  Lab 06/23/22 2122 06/24/22 0338 06/24/22 0729 06/24/22 1345 06/25/22 0011 06/25/22 0735  NA 159* 157* 156* 153* 148* 148*  K 3.7 3.8 3.6 4.3  --  4.4  CL 124* 120* 123* 118*  --  115*  CO2 28 27 27 23   --  25  GLUCOSE 211* 235* 203* 326*  --  345*  BUN 55* 55* 50* 47*  --  30*  CREATININE 1.67* 1.65* 1.67* 1.52*  --  1.24*  CALCIUM 9.5 9.6 9.4 8.9  --  8.8*  MG 3.3*  --  3.0*  --   --  2.4  PHOS 2.1*  --  2.6  --   --  1.9*   GFR: Estimated  Creatinine Clearance: 63.4 mL/min (A) (by C-G formula based on SCr of 1.24 mg/dL (H)). Liver Function Tests: Recent Labs  Lab 06/23/22 1004 06/24/22 0338 06/25/22 0735  AST 20 19  --   ALT 21 16  --   ALKPHOS 126 101  --   BILITOT 2.0* 1.2  --   PROT 8.9* 7.3  --   ALBUMIN 4.3 3.7 3.2*   Recent Labs  Lab 06/23/22 1004 06/24/22 0338 06/25/22 0735  LIPASE 101* 546* 107*  AMYLASE  --  729*  --    No results for input(s): "AMMONIA" in the last 168 hours. Coagulation Profile: No results for input(s): "INR", "PROTIME" in the last 168 hours. Cardiac Enzymes: No  results for input(s): "CKTOTAL", "CKMB", "CKMBINDEX", "TROPONINI" in the last 168 hours. BNP (last 3 results) No results for input(s): "PROBNP" in the last 8760 hours. HbA1C: No results for input(s): "HGBA1C" in the last 72 hours. CBG: Recent Labs  Lab 06/24/22 1633 06/24/22 1940 06/24/22 2335 06/25/22 0333 06/25/22 0737  GLUCAP 386* 361* 331* 376* 332*   Lipid Profile: Recent Labs    06/23/22 1632  TRIG 259*   Thyroid Function Tests: No results for input(s): "TSH", "T4TOTAL", "FREET4", "T3FREE", "THYROIDAB" in the last 72 hours. Anemia Panel: No results for input(s): "VITAMINB12", "FOLATE", "FERRITIN", "TIBC", "IRON", "RETICCTPCT" in the last 72 hours. Urine analysis:    Component Value Date/Time   COLORURINE YELLOW (A) 06/23/2022 0054   APPEARANCEUR CLEAR (A) 06/23/2022 0054   APPEARANCEUR Clear 12/19/2012 0014   LABSPEC 1.029 06/23/2022 0054   LABSPEC 1.010 12/19/2012 0014   PHURINE 5.0 06/23/2022 0054   GLUCOSEU >=500 (A) 06/23/2022 0054   GLUCOSEU Negative 12/19/2012 0014   HGBUR NEGATIVE 06/23/2022 0054   BILIRUBINUR NEGATIVE 06/23/2022 0054   BILIRUBINUR Negative 12/19/2012 0014   KETONESUR 20 (A) 06/23/2022 0054   PROTEINUR NEGATIVE 06/23/2022 0054   NITRITE NEGATIVE 06/23/2022 0054   LEUKOCYTESUR NEGATIVE 06/23/2022 0054   LEUKOCYTESUR 1+ 12/19/2012 0014   Sepsis  Labs: @LABRCNTIP (procalcitonin:4,lacticidven:4)  ) Recent Results (from the past 240 hour(s))  MRSA Next Gen by PCR, Nasal     Status: None   Collection Time: 06/23/22  2:37 PM   Specimen: Nasal Mucosa; Nasal Swab  Result Value Ref Range Status   MRSA by PCR Next Gen NOT DETECTED NOT DETECTED Final    Comment: (NOTE) The GeneXpert MRSA Assay (FDA approved for NASAL specimens only), is one component of a comprehensive MRSA colonization surveillance program. It is not intended to diagnose MRSA infection nor to guide or monitor treatment for MRSA infections. Test performance is not FDA approved in patients less than 89 years old. Performed at [facility], [address] [place], [place] [address]          Radiology Studies: DG Abd Portable 1V  Result Date: 06/24/2022 CLINICAL DATA:  NG tube placement EXAM: PORTABLE ABDOMEN - 1 VIEW COMPARISON:  Study done earlier today FINDINGS: Bowel gas pattern is nonspecific. Moderate to large amount of stool is seen in colon. Tip of NG tube is seen in the region of the antrum of the stomach. Coiling of NG tubes seen within the stomach is less prominent in comparison with the previous study. IMPRESSION: Tip of NG tube is seen in the region of the antrum of the stomach. Electronically Signed   By: Ernie Avena M.D.   On: 06/24/2022 19:19   ECHOCARDIOGRAM COMPLETE  Result Date: 06/24/2022    ECHOCARDIOGRAM REPORT   Patient Name:   ZULEIKA GALLUS Date of Exam: 06/24/2022 Medical Rec #:  [id number]    Height:       63.0 in Accession #:    [id number]   Weight:       251.1 lb Date of Birth:  09/14/1965     BSA:          2.130 m Patient Age:    57 years     BP:           146/81 mmHg Patient Gender: F            HR:           113 bpm. Exam Location:  [facility] Procedure: 2D Echo, Cardiac Doppler  and Color Doppler Indications:     Cardiomyopathy  History:         Patient has no prior history of Echocardiogram examinations.                   Cardiomyopathy; Risk Factors:Hypertension, Diabetes and                  Dyslipidemia.  Sonographer:     Mikki Harbor Referring Phys:  [id number] BRITTON L R[place] Diagnosing Phys: Debbe Odea MD  Sonographer Comments: Patient is obese. Image acquisition challenging due to respiratory motion. IMPRESSIONS  1. Left ventricular ejection fraction, by estimation, is 60 to 65%. The left ventricle has normal function. The left ventricle has no regional wall motion abnormalities. Left ventricular diastolic parameters are consistent with Grade I diastolic dysfunction (impaired relaxation).  2. Right ventricular systolic function is normal. The right ventricular size is normal.  3. The mitral valve is normal in structure. No evidence of mitral valve regurgitation.  4. The aortic valve is tricuspid. Aortic valve regurgitation is not visualized.  5. The inferior vena cava is normal in size with greater than 50% respiratory variability, suggesting right atrial pressure of 3 mmHg. FINDINGS  Left Ventricle: Left ventricular ejection fraction, by estimation, is 60 to 65%. The left ventricle has normal function. The left ventricle has no regional wall motion abnormalities. The left ventricular internal cavity size was normal in size. There is  no left ventricular hypertrophy. Left ventricular diastolic parameters are consistent with Grade I diastolic dysfunction (impaired relaxation). Right Ventricle: The right ventricular size is normal. No increase in right ventricular wall thickness. Right ventricular systolic function is normal. Left Atrium: Left atrial size was normal in size. Right Atrium: Right atrial size was normal in size. Pericardium: There is no evidence of pericardial effusion. Mitral Valve: The mitral valve is normal in structure. No evidence of mitral valve regurgitation. MV peak gradient, 6.9 mmHg. The mean mitral valve gradient is 2.0 mmHg. Tricuspid Valve: The tricuspid valve is normal in structure.  Tricuspid valve regurgitation is mild. Aortic Valve: The aortic valve is tricuspid. Aortic valve regurgitation is not visualized. Aortic valve mean gradient measures 5.0 mmHg. Aortic valve peak gradient measures 10.4 mmHg. Aortic valve area, by VTI measures 2.96 cm. Pulmonic Valve: The pulmonic valve was normal in structure. Pulmonic valve regurgitation is not visualized. Aorta: The aortic root is normal in size and structure. Venous: The inferior vena cava is normal in size with greater than 50% respiratory variability, suggesting right atrial pressure of 3 mmHg. IAS/Shunts: No atrial level shunt detected by color flow Doppler.  LEFT VENTRICLE PLAX 2D LVIDd:         3.50 cm   Diastology LVIDs:         2.30 cm   LV e' medial:    8.05 cm/s LV PW:         1.10 cm   LV E/e' medial:  9.0 LV IVS:        1.20 cm   LV e' lateral:   13.80 cm/s LVOT diam:     2.00 cm   LV E/e' lateral: 5.3 LV SV:         74 LV SV Index:   35 LVOT Area:     3.14 cm  RIGHT VENTRICLE RV Basal diam:  3.35 cm RV Mid diam:    3.30 cm RV S prime:     27.00 cm/s TAPSE (M-mode): 2.8 cm LEFT ATRIUM  Index        RIGHT ATRIUM           Index LA diam:        3.00 cm 1.41 cm/m   RA Area:     17.80 cm LA Vol (A2C):   33.3 ml 15.63 ml/m  RA Volume:   53.10 ml  24.93 ml/m LA Vol (A4C):   30.1 ml 14.13 ml/m LA Biplane Vol: 34.4 ml 16.15 ml/m  AORTIC VALVE                     PULMONIC VALVE AV Area (Vmax):    2.83 cm      PV Vmax:       1.56 m/s AV Area (Vmean):   2.69 cm      PV Peak grad:  9.7 mmHg AV Area (VTI):     2.96 cm AV Vmax:           161.00 cm/s AV Vmean:          105.000 cm/s AV VTI:            0.248 m AV Peak Grad:      10.4 mmHg AV Mean Grad:      5.0 mmHg LVOT Vmax:         145.00 cm/s LVOT Vmean:        90.000 cm/s LVOT VTI:          0.234 m LVOT/AV VTI ratio: 0.94  AORTA Ao Root diam: 3.50 cm MITRAL VALVE MV Area (PHT): 4.01 cm     SHUNTS MV Area VTI:   3.67 cm     Systemic VTI:  0.23 m MV Peak grad:  6.9 mmHg      Systemic Diam: 2.00 cm MV Mean grad:  2.0 mmHg MV Vmax:       1.31 m/s MV Vmean:      66.6 cm/s MV Decel Time: 189 msec MV E velocity: 72.80 cm/s MV A velocity: 121.00 cm/s MV E/A ratio:  0.60 Debbe Odea MD Electronically signed by Debbe Odea MD Signature Date/Time: 06/24/2022/4:41:01 PM    Final    US Abdomen Limited RUQ (LIVER/GB)  Result Date: 06/24/2022 CLINICAL DATA:  Pancreatitis EXAM: ULTRASOUND ABDOMEN LIMITED RIGHT UPPER QUADRANT COMPARISON:  CT abdomen/pelvis 1 day prior, right upper quadrant ultrasound 04/19/2020 FINDINGS: Gallbladder: No gallstones or wall thickening visualized. No sonographic Murphy sign noted by sonographer. Common bile duct: Diameter: 5 mm Liver: No focal lesion identified. Within normal limits in parenchymal echogenicity. Portal vein is patent on color Doppler imaging with normal direction of blood flow towards the liver. Other: None. IMPRESSION: Normal right upper quadrant ultrasound. Electronically Signed   By: Lesia Hausen M.D.   On: 06/24/2022 10:42   DG Abd 1 View  Result Date: 06/24/2022 CLINICAL DATA:  Encounter for orogastric (OG) tube placement [id number] EXAM: ABDOMEN - 1 VIEW COMPARISON:  06/23/2022 FINDINGS: A feeding tube is looped on itself in the stomach. The tip is positioned in the mid stomach directed towards the pylorus. Stable nonspecific bowel gas pattern. IMPRESSION: Feeding tube tip is positioned in the mid stomach. Electronically Signed   By: Kennith Center M.D.   On: 06/24/2022 07:41   DG Abd Portable 1V  Result Date: 06/23/2022 CLINICAL DATA:  Nasogastric tube placement EXAM: PORTABLE ABDOMEN - 1 VIEW COMPARISON:  CT 06/23/2022 FINDINGS: An enteric feeding tube has been placed. The tube is coiled in the left upper quadrant with tip projecting  over the left upper quadrant. This is consistent with location in the body of the stomach. Visualized bowel gas pattern is normal. Lung bases are clear. IMPRESSION: Enteric feeding tube tip is in the  left upper quadrant consistent with location in the body of the stomach. Electronically Signed   By: Burman Nieves M.D.   On: 06/23/2022 23:56   CT ABDOMEN PELVIS WO CONTRAST  Result Date: 06/23/2022 CLINICAL DATA:  Abdominal pain, acute, nonlocalized EXAM: CT ABDOMEN AND PELVIS WITHOUT CONTRAST TECHNIQUE: Multidetector CT imaging of the abdomen and pelvis was performed following the standard protocol without IV contrast. RADIATION DOSE REDUCTION: This exam was performed according to the departmental dose-optimization program which includes automated exposure control, adjustment of the mA and/or kV according to patient size and/or use of iterative reconstruction technique. COMPARISON:  None Available. FINDINGS: Lower chest: Mild bibasilar atelectasis. Moderate coronary artery calcification. Global cardiac size within normal limits. No acute abnormality. Hepatobiliary: No focal liver abnormality is seen. No gallstones, gallbladder wall thickening, or biliary dilatation. Pancreas: There is thickening of the pancreatic head with mild peripancreatic inflammatory stranding best appreciated surrounding the pancreatic head and within the pancreaticoduodenal groove in keeping with changes of acute pancreatitis. Viability of the pancreatic parenchyma is not well assessed on this noncontrast examination. No peripancreatic fluid collections are identified. The pancreatic duct is not dilated. Spleen: Unremarkable Adrenals/Urinary Tract: The adrenal glands are unremarkable. Kidneys are normal on this noncontrast examination. Foley catheter balloon is seen within a decompressed bladder lumen. Stomach/Bowel: Moderate colonic stool burden without evidence of obstruction. Stomach, small bowel, and large bowel are otherwise unremarkable. Appendix normal. No free intraperitoneal gas or fluid. Vascular/Lymphatic: Aortic atherosclerosis. No enlarged abdominal or pelvic lymph nodes. Reproductive: Uterus and bilateral adnexa are  unremarkable. Other: No abdominal wall hernia or abnormality. No abdominopelvic ascites. Musculoskeletal: Degenerative changes seen within the lumbar spine. No acute bone abnormality. No lytic or blastic bone lesion. IMPRESSION: 1. Acute pancreatitis. Pancreatic parenchymal viability is not well assessed on this noncontrast examination. No peripancreatic fluid collections identified. 2. Moderate coronary artery calcification. 3. Moderate colonic stool burden without evidence of obstruction. 4. Aortic atherosclerosis. Aortic Atherosclerosis (ICD10-I70.0). Electronically Signed   By: Helyn Numbers M.D.   On: 06/23/2022 21:39   CT HEAD WO CONTRAST ( )  Result Date: 06/23/2022 CLINICAL DATA:  Mental status change, unknown cause EXAM: CT HEAD WITHOUT CONTRAST TECHNIQUE: Contiguous axial images were obtained from the base of the skull through the vertex without intravenous contrast. RADIATION DOSE REDUCTION: This exam was performed according to the departmental dose-optimization program which includes automated exposure control, adjustment of the mA and/or kV according to patient size and/or use of iterative reconstruction technique. COMPARISON:  11/17/2010 FINDINGS: Brain: No acute intracranial abnormality. Specifically, no hemorrhage, hydrocephalus, mass lesion, acute infarction, or significant intracranial injury. Vascular: No hyperdense vessel or unexpected calcification. Skull: No acute calvarial abnormality. Sinuses/Orbits: No acute findings Other: None IMPRESSION: No acute intracranial abnormality. Electronically Signed   By: Charlett Nose M.D.   On: 06/23/2022 20:21        Scheduled Meds:  atorvastatin  20 mg Per Tube QHS   Chlorhexidine Gluconate Cloth  6 each Topical Daily   cloNIDine  0.3 mg Per Tube BID   enoxaparin (LOVENOX) injection  0.5 mg/kg Subcutaneous Q24H   famotidine  20 mg Per Tube Daily   feeding supplement (VITAL 1.5 CAL)  1,000 mL Per Tube Q24H   folic acid  1 mg Intravenous  Daily   free water  100 mL Per Tube Q6H   hydrALAZINE  100 mg Per Tube TID   insulin aspart  0-15 Units Subcutaneous Q4H   insulin glargine-yfgn  14 Units Subcutaneous BID   megestrol  40 mg Per Tube Daily   montelukast  10 mg Per Tube QHS   multivitamin with minerals  1 tablet Per Tube Daily   mouth rinse  15 mL Mouth Rinse 4 times per day   potassium & sodium phosphates  2 packet Per Tube Q4H   thiamine (VITAMIN B1) injection  100 mg Intravenous Daily   Continuous Infusions:  sodium chloride 125 mL/hr at 06/25/22 0858     LOS: 2 days     Silvano Bilis, MD [facility]   If 7PM-7AM, please contact night-coverage [email or web address] Password [facility] 06/25/2022, 11:25 AM

## 2022-06-25 NOTE — Progress Notes (Signed)
PHARMACY CONSULT NOTE - FOLLOW UP  Pharmacy Consult for Electrolyte Monitoring and Replacement   Recent Labs: Potassium (mmol/L)  Date Value  06/24/2022 4.3  12/19/2012 3.8   Magnesium (mg/dL)  Date Value  96/06/5407 3.0 (H)   Calcium (mg/dL)  Date Value  81/19/1478 8.9   Calcium, Total (mg/dL)  Date Value  29/56/2130 9.5   Albumin (g/dL)  Date Value  86/57/8469 3.7  12/19/2012 3.8   Phosphorus (mg/dL)  Date Value  62/95/2841 2.6   Sodium (mmol/L)  Date Value  06/25/2022 148 (H)  12/19/2012 137    Assessment: 57 year old female admitted with DKA and acute pancreatitis. PMH includes Stage 3a CKD, HLD, HTN, Morbid Obesity, OSA (CPAP qhs), Type II Diabetes Mellitus.  Anion gap closed. IV insulin transitioned off. CBG 170s at time of transitioning to subQ insulin  Renal function improving.   Goal of Therapy:  Electrolytes within normal limits  Plan:  --Phos 1.9. Kphos per tube 2 packets every 4 hours x 4 doses. --Noted on free water every 6 hours and 0.45%NaCl /hr. Continue to monitor fluid status and Na.  --Follow up BMP, Mag, and phos tomorrow AM.   Elliot Gurney, PharmD, BCPS Clinical Pharmacist  06/25/2022 7:53 AM

## 2022-06-25 NOTE — Progress Notes (Signed)
Date and time results received: 06/25/22 0535 (use smartphrase ".now" to insert current time)  Test: Serum Osmolality Critical Value: 348  Name of Provider Notified: Manuela Schwartz NP  Orders Received? No new orders. Continuing current IV fluid.

## 2022-06-25 NOTE — Progress Notes (Signed)
Pt coughing when small bore feeding tube flushed. Md and RD notified and advised to pull back position to 65 and repeat KUB. Tube repositioned and Abd xray performed. All meds and feeds held at this time.

## 2022-06-26 DIAGNOSIS — E1110 Type 2 diabetes mellitus with ketoacidosis without coma: Secondary | ICD-10-CM | POA: Diagnosis not present

## 2022-06-26 LAB — CBC
HCT: 40.1 % (ref 36.0–46.0)
Hemoglobin: 12.4 g/dL (ref 12.0–15.0)
MCH: 27.2 pg (ref 26.0–34.0)
MCHC: 30.9 g/dL (ref 30.0–36.0)
MCV: 87.9 fL (ref 80.0–100.0)
Platelets: 108 10*3/uL — ABNORMAL LOW (ref 150–400)
RBC: 4.56 MIL/uL (ref 3.87–5.11)
RDW: 14.7 % (ref 11.5–15.5)
WBC: 6.5 10*3/uL (ref 4.0–10.5)
nRBC: 0 % (ref 0.0–0.2)

## 2022-06-26 LAB — GLUCOSE, CAPILLARY
Glucose-Capillary: 238 mg/dL — ABNORMAL HIGH (ref 70–99)
Glucose-Capillary: 332 mg/dL — ABNORMAL HIGH (ref 70–99)
Glucose-Capillary: 348 mg/dL — ABNORMAL HIGH (ref 70–99)
Glucose-Capillary: 298 mg/dL — ABNORMAL HIGH (ref 70–99)
Glucose-Capillary: 299 mg/dL — ABNORMAL HIGH (ref 70–99)

## 2022-06-26 LAB — RENAL FUNCTION PANEL
Albumin: 2.9 g/dL — ABNORMAL LOW (ref 3.5–5.0)
Anion gap: 8 (ref 5–15)
BUN: 22 mg/dL — ABNORMAL HIGH (ref 6–20)
CO2: 23 mmol/L (ref 22–32)
Calcium: 8.1 mg/dL — ABNORMAL LOW (ref 8.9–10.3)
Chloride: 113 mmol/L — ABNORMAL HIGH (ref 98–111)
Creatinine, Ser: 0.89 mg/dL (ref 0.44–1.00)
GFR, Estimated: >60 mL/min (ref >60)
Glucose, Bld: 243 mg/dL — ABNORMAL HIGH (ref 70–99)
Phosphorus: 2.3 mg/dL — ABNORMAL LOW (ref 2.5–4.6)
Potassium: 3.9 mmol/L (ref 3.5–5.1)
Sodium: 144 mmol/L (ref 135–145)

## 2022-06-26 LAB — MAGNESIUM: Magnesium: 2.3 mg/dL (ref 1.7–2.4)

## 2022-06-26 MED ORDER — POLYETHYLENE GLYCOL 3350 17 G PO PACK
34.0000 g | PACK | Freq: Every day | ORAL | Status: DC
  Administered 2022-06-26 – 2022-06-29 (×4): 34 g via ORAL
  Filled 2022-06-26 (×4): qty 2

## 2022-06-26 MED ORDER — DICYCLOMINE HCL 10 MG/5ML PO SOLN
20.0000 mg | Freq: Once | ORAL | Status: AC
  Administered 2022-06-26: 20 mg
  Filled 2022-06-26: qty 10

## 2022-06-26 MED ORDER — SENNA 8.6 MG PO TABS
1.0000 | ORAL_TABLET | Freq: Every day | ORAL | Status: DC
  Administered 2022-06-26 – 2022-06-29 (×4): 8.6 mg via ORAL
  Filled 2022-06-26 (×4): qty 1

## 2022-06-26 MED ORDER — CLONIDINE HCL 0.1 MG PO TABS: 0.3000 mg | ORAL_TABLET | Freq: Two times a day (BID) | ORAL | Status: DC

## 2022-06-26 MED ORDER — HYDRALAZINE HCL 50 MG PO TABS: 100.0000 mg | ORAL_TABLET | Freq: Three times a day (TID) | ORAL | Status: DC

## 2022-06-26 MED ORDER — MEGESTROL ACETATE 20 MG PO TABS
40.0000 mg | ORAL_TABLET | Freq: Every day | ORAL | Status: DC
  Administered 2022-06-27 – 2022-06-29 (×3): 40 mg via ORAL
  Filled 2022-06-26 (×3): qty 2

## 2022-06-26 MED ORDER — SODIUM CHLORIDE 0.45 % IV SOLN: INTRAVENOUS | Status: DC

## 2022-06-26 MED ORDER — ADULT MULTIVITAMIN W/MINERALS CH
1.0000 | ORAL_TABLET | Freq: Every day | ORAL | Status: DC
  Administered 2022-06-27 – 2022-06-29 (×3): 1 via ORAL
  Filled 2022-06-26 (×3): qty 1

## 2022-06-26 MED ORDER — LACTULOSE 10 GM/15ML PO SOLN
20.0000 g | Freq: Every day | ORAL | Status: DC
  Administered 2022-06-27: 20 g via ORAL
  Filled 2022-06-26: qty 30

## 2022-06-26 MED ORDER — GLUCERNA SHAKE PO LIQD
237.0000 mL | Freq: Three times a day (TID) | ORAL | Status: DC
  Administered 2022-06-26 – 2022-06-29 (×6): 237 mL via ORAL

## 2022-06-26 MED ORDER — FAMOTIDINE 20 MG PO TABS
20.0000 mg | ORAL_TABLET | Freq: Every day | ORAL | Status: DC
  Administered 2022-06-27 – 2022-06-29 (×3): 20 mg via ORAL
  Filled 2022-06-26 (×3): qty 1

## 2022-06-26 MED ORDER — MONTELUKAST SODIUM 10 MG PO TABS
10.0000 mg | ORAL_TABLET | Freq: Every day | ORAL | Status: DC
  Administered 2022-06-26 – 2022-06-28 (×3): 10 mg via ORAL
  Filled 2022-06-26 (×3): qty 1

## 2022-06-26 MED ORDER — FREE WATER: 100.0000 mL | Freq: Three times a day (TID) | Status: DC

## 2022-06-26 MED ORDER — INSULIN GLARGINE-YFGN 100 UNIT/ML ~~LOC~~ SOLN
18.0000 [IU] | Freq: Two times a day (BID) | SUBCUTANEOUS | Status: DC
  Administered 2022-06-26 – 2022-06-29 (×7): 18 [IU] via SUBCUTANEOUS
  Filled 2022-06-26 (×8): qty 0.18

## 2022-06-26 MED ORDER — POTASSIUM & SODIUM PHOSPHATES 280-160-250 MG PO PACK
2.0000 | PACK | ORAL | Status: AC
  Administered 2022-06-26 (×2): 2
  Filled 2022-06-26 (×2): qty 2

## 2022-06-26 NOTE — Progress Notes (Addendum)
PROGRESS NOTE    Cynthia Ayers  WUJ:[id number] DOB: 03-30-1965 DOA: 06/23/2022 PCP: Alm Bustard, NP     Brief Narrative:   From admission h and p  Cynthia Ayers is a 57 y.o. female with medical history significant of DM, HTN, HLD, morbid obesity, OSA on CPAP, depression with anxiety, CKD 3A, who presents with altered mental status.   Patient has AMS, can only provide limited medical history.  I tried to call her daughter without success, her daughter's cell phone is not set up for leaving a message. Therefore, most of the history is obtained by discussing the case with ED physician, per EMS report, and with the nursing staff.    Per report, pts coworkers called EMS when the pt did not show up to work today.  When EMS arrived at pts home she was found in bed confused.They checked a blood sugar which was greater than 500. Per EDP, initially patient could not answer questions or follow commands.  Patient was given IV fluid in ED, and started on insulin drip.  Her mental status improved slightly.  When I saw patient in ED, patient is confused about year 2024.  She knows her own name, knows that she is in the hospital.  She denies chest pain.  No active respiratory distress or cough noted.  She complains of abdominal pain, but cannot provide detailed information.  No active nausea, vomiting or diarrhea noted.  She moves all extremities. Later on, after pt is transferred to SDU, she becomes very agitated, not following command.   Later day of admission transferred to ICU with severe encephalopathy. Trialed precedex but developed bradycardia  Assessment & Plan:   Principal Problem:   DKA (diabetic ketoacidosis) (HCC) Active Problems:   Type II diabetes mellitus with renal manifestations (HCC)   Acute metabolic encephalopathy   HTN (hypertension)   HLD (hyperlipidemia)   Dehydration   Acute renal failure superimposed on stage 3a chronic kidney disease (HCC)   Elevated lactic acid level    Abdominal pain   OSA on CPAP   Morbid obesity with BMI of 45.0-49.9, adult (HCC)  # DKA # T2DM Pancreatitis likely contributory. Recent steroids also likely contributory. A1c 8.2. gap closed, last beta-h was close to normal. Now off insulin gtt. Glucose remains elevated but improving - continue semglee and SSI q4, will switch to mealtime if eats lunch today  # Acute metabolic encephalopathy Likely 2/2 dka, pancreatitis. CT head neg. UDS unremarkable. Now resolved - monitor  # Debility PT advising SNF but with dramatic improvement today I think possibly won't need this - PT to re-eval  # Bradycardia With precedex, resolved. TTE unremarkable - monitor  # Acute pancreatitis Mild elevation of pancreatic enzymes, non-con CT showing pancreatitis. Triglycerides not significantly elevated. T bili is mildly elevated. No stones seen on CT nor stones or biliary duct dilitation seen on RUQ u/s.  Home semaglutide may have caused, would hold at d/c. No abd pain today - will advance diet and if tolerates d/c NG tube  # Hypernatremia Likely 2/2 dehydration. Corrected sodium of 146 today was 152 yesterday - gentle hydration 0.45 ns @ 75  # HTN Wnl today -  cont home clonidine and hydral - home spiro on hold  # Thrombocytopenia Stable in the low 100s, likely 2/2 acute illness - monitor, further w/u if persists/worsens  # Constipation Says last BM was this past weekend - cont lactulose - add miralax/senna  # AKI Baseline cr of around 1,  presented with 2.25, now resolved with fluids - monitor  # Narcolepsy? - resume home armodafinil when more lucid  # Asthma - dulera for home advair  # Acute urinary retention - will attempt TOV today  # OSA - cpap qhs  # obesity Noted    DVT prophylaxis: lovenox Code Status: full Family Communication: daughter updated telephonically 4/25  Level of care: Stepdown Status is: Inpatient Remains inpatient appropriate because: severity of  illness    Consultants:  pccm  Procedures: none  Antimicrobials:  none    Subjective: Feeling well, has appetite, no abd pain, awake and alert  Objective: Vitals:   06/26/22 0700 06/26/22 0738 06/26/22 0800 06/26/22 0900  BP: (!) 130/90  127/68 135/69  Pulse: 95  80 93  Resp: 20  13 17   Temp:  99 F (37.2 C)    TempSrc:  Axillary    SpO2: 98%  98% 96%  Weight:      Height:        Intake/Output Summary (Last 24 hours) at 06/26/2022 1004 Last data filed at 06/26/2022 0600 Gross per 24 hour  Intake 2779.71 ml  Output 1200 ml  Net 1579.71 ml   Filed Weights   06/23/22 1415 06/25/22 0500 06/26/22 0500  Weight: 113.9 kg 117.2 kg 117.9 kg    Examination:  General exam: NAD Respiratory system: Clear to auscultation. Respiratory effort normal. Cardiovascular system: tachycardic Gastrointestinal system: Abdomen is obese, soft and nontender. No organomegaly or masses felt. Normal bowel sounds heard. Central nervous system: awake but somnolent, moving all 4 Extremities: Symmetric 5 x 5 power. Trace LE edema Skin: No rashes, lesions or ulcers Psychiatry: aao x3    Data Reviewed: I have personally reviewed following labs and imaging studies  CBC: Recent Labs  Lab 06/23/22 1004 06/24/22 0338 06/25/22 0928 06/26/22 0440  WBC 10.1 12.7* 7.6 6.5  HGB 15.5* 14.7 13.6 12.4  HCT 49.7* 46.7* 43.9 40.1  MCV 86.3 85.7 87.3 87.9  PLT 246 202 130* 108*   Basic Metabolic Panel: Recent Labs  Lab 06/23/22 2122 06/24/22 0338 06/24/22 0729 06/24/22 1345 06/25/22 0011 06/25/22 0735 06/26/22 0440  NA 159* 157* 156* 153* 148* 148* 144  K 3.7 3.8 3.6 4.3  --  4.4 3.9  CL 124* 120* 123* 118*  --  115* 113*  CO2 28 27 27 23   --  25 23  GLUCOSE 211* 235* 203* 326*  --  345* 243*  BUN 55* 55* 50* 47*  --  30* 22*  CREATININE 1.67* 1.65* 1.67* 1.52*  --  1.24* 0.89  CALCIUM 9.5 9.6 9.4 8.9  --  8.8* 8.1*  MG 3.3*  --  3.0*  --   --  2.4 2.3  PHOS 2.1*  --  2.6  --   --   1.9* 2.3*   GFR: Estimated Creatinine Clearance: 88.6 mL/min (by C-G formula based on SCr of 0.89 mg/dL). Liver Function Tests: Recent Labs  Lab 06/23/22 1004 06/24/22 0338 06/25/22 0735 06/26/22 0440  AST 20 19  --   --   ALT 21 16  --   --   ALKPHOS 126 101  --   --   BILITOT 2.0* 1.2  --   --   PROT 8.9* 7.3  --   --   ALBUMIN 4.3 3.7 3.2* 2.9*   Recent Labs  Lab 06/23/22 1004 06/24/22 0338 06/25/22 0735  LIPASE 101* 546* 107*  AMYLASE  --  729*  --  No results for input(s): "AMMONIA" in the last 168 hours. Coagulation Profile: No results for input(s): "INR", "PROTIME" in the last 168 hours. Cardiac Enzymes: No results for input(s): "CKTOTAL", "CKMB", "CKMBINDEX", "TROPONINI" in the last 168 hours. BNP (last 3 results) No results for input(s): "PROBNP" in the last 8760 hours. HbA1C: Recent Labs    06/25/22 0011  HGBA1C 12.2*   CBG: Recent Labs  Lab 06/25/22 1533 06/25/22 1926 06/25/22 2350 06/26/22 0324 06/26/22 0726  GLUCAP 201* 263* 231* 238* 332*   Lipid Profile: Recent Labs    06/23/22 1632  TRIG 259*   Thyroid Function Tests: No results for input(s): "TSH", "T4TOTAL", "FREET4", "T3FREE", "THYROIDAB" in the last 72 hours. Anemia Panel: No results for input(s): "VITAMINB12", "FOLATE", "FERRITIN", "TIBC", "IRON", "RETICCTPCT" in the last 72 hours. Urine analysis:    Component Value Date/Time   COLORURINE YELLOW (A) 06/23/2022 0054   APPEARANCEUR CLEAR (A) 06/23/2022 0054   APPEARANCEUR Clear 12/19/2012 0014   LABSPEC 1.029 06/23/2022 0054   LABSPEC 1.010 12/19/2012 0014   PHURINE 5.0 06/23/2022 0054   GLUCOSEU >=500 (A) 06/23/2022 0054   GLUCOSEU Negative 12/19/2012 0014   HGBUR NEGATIVE 06/23/2022 0054   BILIRUBINUR NEGATIVE 06/23/2022 0054   BILIRUBINUR Negative 12/19/2012 0014   KETONESUR 20 (A) 06/23/2022 0054   PROTEINUR NEGATIVE 06/23/2022 0054   NITRITE NEGATIVE 06/23/2022 0054   LEUKOCYTESUR NEGATIVE 06/23/2022 0054    LEUKOCYTESUR 1+ 12/19/2012 0014   Sepsis Labs: @LABRCNTIP (procalcitonin:4,lacticidven:4)  ) Recent Results (from the past 240 hour(s))  MRSA Next Gen by PCR, Nasal     Status: None   Collection Time: 06/23/22  2:37 PM   Specimen: Nasal Mucosa; Nasal Swab  Result Value Ref Range Status   MRSA by PCR Next Gen NOT DETECTED NOT DETECTED Final    Comment: (NOTE) The GeneXpert MRSA Assay (FDA approved for NASAL specimens only), is one component of a comprehensive MRSA colonization surveillance program. It is not intended to diagnose MRSA infection nor to guide or monitor treatment for MRSA infections. Test performance is not FDA approved in patients less than 56 years old. Performed at [facility], [address], [place], [place] [address]          Radiology Studies: DG Naso/Oro Gtube Thru Duo-Reposition  Result Date: 06/25/2022 INDICATION: Provided history: On tube feeding diet. Request received for enteric tube advancement to a post-pyloric position. EXAM: ENTERIC TUBE ADVANCEMENT UNDER FLUOROSCOPY. MEDICATIONS: None. ANESTHESIA/SEDATION: None. CONTRAST:  None. FLUOROSCOPY: Fluoroscopy time: 48 seconds (25.2 mGy). COMPLICATIONS: None immediate. PROCEDURE: The patient was transported to the fluoroscopy suite with a nasoenteric tube present, terminating at the level of the gastric antrum. Under intermittent fluoroscopic guidance, the nasoenteric tube was advanced as far as could be achieved. At procedure termination, the tip of the enteric tube was located in the expected location of the second portion of the duodenum. The tube was secured at the nose with adhesive. IMPRESSION: 1. Nasoenteric tube advancement under fluoroscopic guidance. 2. At procedure termination, the tip of the enteric tube is located in the expected location of the second portion of the duodenum. The tube was secured at the nose with adhesive. Electronically Signed   By: Jackey Loge D.O.   On: 06/25/2022  15:01   DG Abd Portable 1V  Result Date: 06/24/2022 CLINICAL DATA:  NG tube placement EXAM: PORTABLE ABDOMEN - 1 VIEW COMPARISON:  Study done earlier today FINDINGS: Bowel gas pattern is nonspecific. Moderate to large amount of stool is seen in colon.  Tip of NG tube is seen in the region of the antrum of the stomach. Coiling of NG tubes seen within the stomach is less prominent in comparison with the previous study. IMPRESSION: Tip of NG tube is seen in the region of the antrum of the stomach. Electronically Signed   By: Ernie Avena M.D.   On: 06/24/2022 19:19   ECHOCARDIOGRAM COMPLETE  Result Date: 06/24/2022    ECHOCARDIOGRAM REPORT   Patient Name:   TAYLEE GUNNELLS Date of Exam: 06/24/2022 Medical Rec #:  [id number]    Height:       63.0 in Accession #:    [id number]   Weight:       251.1 lb Date of Birth:  12-25-1965     BSA:          2.130 m Patient Age:    57 years     BP:           146/81 mmHg Patient Gender: F            HR:           113 bpm. Exam Location:  [facility] Procedure: 2D Echo, Cardiac Doppler and Color Doppler Indications:     Cardiomyopathy  History:         Patient has no prior history of Echocardiogram examinations.                  Cardiomyopathy; Risk Factors:Hypertension, Diabetes and                  Dyslipidemia.  Sonographer:     Mikki Harbor Referring Phys:  [id number] BRITTON L RUST[place]CHESTER Diagnosing Phys: Debbe Odea MD  Sonographer Comments: Patient is obese. Image acquisition challenging due to respiratory motion. IMPRESSIONS  1. Left ventricular ejection fraction, by estimation, is 60 to 65%. The left ventricle has normal function. The left ventricle has no regional wall motion abnormalities. Left ventricular diastolic parameters are consistent with Grade I diastolic dysfunction (impaired relaxation).  2. Right ventricular systolic function is normal. The right ventricular size is normal.  3. The mitral valve is normal in structure. No evidence of mitral valve  regurgitation.  4. The aortic valve is tricuspid. Aortic valve regurgitation is not visualized.  5. The inferior vena cava is normal in size with greater than 50% respiratory variability, suggesting right atrial pressure of 3 mmHg. FINDINGS  Left Ventricle: Left ventricular ejection fraction, by estimation, is 60 to 65%. The left ventricle has normal function. The left ventricle has no regional wall motion abnormalities. The left ventricular internal cavity size was normal in size. There is  no left ventricular hypertrophy. Left ventricular diastolic parameters are consistent with Grade I diastolic dysfunction (impaired relaxation). Right Ventricle: The right ventricular size is normal. No increase in right ventricular wall thickness. Right ventricular systolic function is normal. Left Atrium: Left atrial size was normal in size. Right Atrium: Right atrial size was normal in size. Pericardium: There is no evidence of pericardial effusion. Mitral Valve: The mitral valve is normal in structure. No evidence of mitral valve regurgitation. MV peak gradient, 6.9 mmHg. The mean mitral valve gradient is 2.0 mmHg. Tricuspid Valve: The tricuspid valve is normal in structure. Tricuspid valve regurgitation is mild. Aortic Valve: The aortic valve is tricuspid. Aortic valve regurgitation is not visualized. Aortic valve mean gradient measures 5.0 mmHg. Aortic valve peak gradient measures 10.4 mmHg. Aortic valve area, by VTI measures 2.96 cm. Pulmonic Valve: The pulmonic valve was normal in  structure. Pulmonic valve regurgitation is not visualized. Aorta: The aortic root is normal in size and structure. Venous: The inferior vena cava is normal in size with greater than 50% respiratory variability, suggesting right atrial pressure of 3 mmHg. IAS/Shunts: No atrial level shunt detected by color flow Doppler.  LEFT VENTRICLE PLAX 2D LVIDd:         3.50 cm   Diastology LVIDs:         2.30 cm   LV e' medial:    8.05 cm/s LV PW:          1.10 cm   LV E/e' medial:  9.0 LV IVS:        1.20 cm   LV e' lateral:   13.80 cm/s LVOT diam:     2.00 cm   LV E/e' lateral: 5.3 LV SV:         74 LV SV Index:   35 LVOT Area:     3.14 cm  RIGHT VENTRICLE RV Basal diam:  3.35 cm RV Mid diam:    3.30 cm RV S prime:     27.00 cm/s TAPSE (M-mode): 2.8 cm LEFT ATRIUM             Index        RIGHT ATRIUM           Index LA diam:        3.00 cm 1.41 cm/m   RA Area:     17.80 cm LA Vol (A2C):   33.3 ml 15.63 ml/m  RA Volume:   53.10 ml  24.93 ml/m LA Vol (A4C):   30.1 ml 14.13 ml/m LA Biplane Vol: 34.4 ml 16.15 ml/m  AORTIC VALVE                     PULMONIC VALVE AV Area (Vmax):    2.83 cm      PV Vmax:       1.56 m/s AV Area (Vmean):   2.69 cm      PV Peak grad:  9.7 mmHg AV Area (VTI):     2.96 cm AV Vmax:           161.00 cm/s AV Vmean:          105.000 cm/s AV VTI:            0.248 m AV Peak Grad:      10.4 mmHg AV Mean Grad:      5.0 mmHg LVOT Vmax:         145.00 cm/s LVOT Vmean:        90.000 cm/s LVOT VTI:          0.234 m LVOT/AV VTI ratio: 0.94  AORTA Ao Root diam: 3.50 cm MITRAL VALVE MV Area (PHT): 4.01 cm     SHUNTS MV Area VTI:   3.67 cm     Systemic VTI:  0.23 m MV Peak grad:  6.9 mmHg     Systemic Diam: 2.00 cm MV Mean grad:  2.0 mmHg MV Vmax:       1.31 m/s MV Vmean:      66.6 cm/s MV Decel Time: 189 msec MV E velocity: 72.80 cm/s MV A velocity: 121.00 cm/s MV E/A ratio:  0.60 Debbe Odea MD Electronically signed by Debbe Odea MD Signature Date/Time: 06/24/2022/4:41:01 PM    Final    US Abdomen Limited RUQ (LIVER/GB)  Result Date: 06/24/2022 CLINICAL DATA:  Pancreatitis EXAM: ULTRASOUND ABDOMEN LIMITED RIGHT  UPPER QUADRANT COMPARISON:  CT abdomen/pelvis 1 day prior, right upper quadrant ultrasound 04/19/2020 FINDINGS: Gallbladder: No gallstones or wall thickening visualized. No sonographic Murphy sign noted by sonographer. Common bile duct: Diameter: 5 mm Liver: No focal lesion identified. Within normal limits in parenchymal  echogenicity. Portal vein is patent on color Doppler imaging with normal direction of blood flow towards the liver. Other: None. IMPRESSION: Normal right upper quadrant ultrasound. Electronically Signed   By: Lesia Hausen M.D.   On: 06/24/2022 10:42        Scheduled Meds:  atorvastatin  20 mg Per Tube QHS   Chlorhexidine Gluconate Cloth  6 each Topical Daily   cloNIDine  0.3 mg Per Tube BID   enoxaparin (LOVENOX) injection  0.5 mg/kg Subcutaneous Q24H   famotidine  20 mg Per Tube Daily   folic acid  1 mg Intravenous Daily   free water  100 mL Per Tube Q8H   hydrALAZINE  100 mg Per Tube TID   insulin aspart  0-15 Units Subcutaneous Q4H   insulin glargine-yfgn  18 Units Subcutaneous BID   lactulose  20 g Per Tube Daily   megestrol  40 mg Per Tube Daily   montelukast  10 mg Per Tube QHS   multivitamin with minerals  1 tablet Per Tube Daily   mouth rinse  15 mL Mouth Rinse 4 times per day   potassium & sodium phosphates  2 packet Per Tube Q4H   thiamine (VITAMIN B1) injection  100 mg Intravenous Daily   Continuous Infusions:  feeding supplement (VITAL 1.5 CAL) 20 mL/hr at 06/26/22 0600     LOS: 3 days     Silvano Bilis, MD [facility]   If 7PM-7AM, please contact night-coverage [email or web address] Password [facility] 06/26/2022, 10:04 AM

## 2022-06-26 NOTE — Progress Notes (Signed)
SLP Cancellation Note  Patient Details Name: Cynthia Ayers MRN: [id number] DOB: November 26, 1965   Cancelled treatment:       Reason Eval/Treat Not Completed: SLP screened, no needs identified, will sign off (chart reviewed; consulted NSG then met w/ pt in room)  Pt denied any difficulty swallowing and is currently on a regular diet; tolerates swallowing pills w/ water per NSG. She has been sipping liquids via straw w/out reported difficulty; pt confirmed this. Noted NGT in place but w/ plan to be removed today per pt/NSG. Pt conversed in conversation w/out expressive/receptive deficits noted; pt denied any speech-language deficits. Speech clear. No further skilled ST services indicated as pt appears at her baseline. Pt agreed. NSG to reconsult if any change in status while admitted.       Jerilynn Som, MS, CCC-SLP Speech Language Pathologist Rehab Services; [facility] [phone] (ascom) [place] 06/26/2022, 11:38 AM

## 2022-06-26 NOTE — Progress Notes (Signed)
Nutrition Follow Up Note   DOCUMENTATION CODES:   Morbid obesity  INTERVENTION:   Glucerna Shake po TID, each supplement provides 220 kcal and 10 grams of protein  MVI po daily   Pt at high refeed risk; recommend monitor potassium, magnesium and phosphorus labs daily until stable  Daily weights   NUTRITION DIAGNOSIS:   Inadequate oral intake related to acute illness as evidenced by NPO status. -resolving   GOAL:   Patient will meet greater than or equal to 90% of their needs -progressing   MONITOR:   PO intake, Supplement acceptance, Labs, Weight trends, I & O's, Skin  ASSESSMENT:   57 y.o. female with medical history significant of DM, HTN, HLD, morbid obesity, OSA on CPAP, depression, anxiety and CKD 3A who is admitted with DKA, AKI and pancreatitis.  Pt s/p fluoroscopy guided post pyloric NGT yesterday. Pt tolerated tube feeds overnight at 87ml/hr. No more vomiting or abdominal pain noted. Pt is more alert today. Pt ate 100% of her clear liquid diet and was advanced to a carb modified diet for lunch. Pt ate > 50% of her lunch today and drank 100% of a Glucerna. NGT discontinued. RD will add supplements and MVI to help pt meet her estimated needs. Pt remains at high refeed risk. No BM yet; pt is receiving lactulose. Per chart, pt is up ~8lbs pta. Pt +6.9(L) on her I & Os.   Medications reviewed and include:  lovenox, pepcid, folic acid, insulin, lactulose, megace, MVI, miralax, senokot, thiamine, NaCl @75ml /hr  Labs reviewed: K 3.9 wnl, BUN 22(H), P 2.3(L), Mg 2.3 wnl lipase 107(H)- 4/25 Cbgs- 348, 332, 238 x 24 hrs   Diet Order:   Diet Order             Diet Carb Modified Fluid consistency: Thin; Room service appropriate? Yes  Diet effective now                  EDUCATION NEEDS:   Not appropriate for education at this time  Skin:  Skin Assessment: Reviewed RN Assessment  Last BM:  4/20  Height:   Ht Readings from Last 1 Encounters:  06/23/22 5\' 3"   (1.6 m)    Weight:   Wt Readings from Last 1 Encounters:  06/26/22 117.9 kg    Ideal Body Weight:  52.2 kg  BMI:  Body mass index is 46.04 kg/m.  Estimated Nutritional Needs:   Kcal:  2200-2500kcal/day  Protein:  110-125g/day  Fluid:  1.6-1.8L/day  Betsey Holiday MS, RD, LDN Please refer to [facility] for RD and/or RD on-call/weekend/after hours pager

## 2022-06-26 NOTE — Progress Notes (Signed)
PHARMACY CONSULT NOTE - FOLLOW UP  Pharmacy Consult for Electrolyte Monitoring and Replacement   Recent Labs: Potassium (mmol/L)  Date Value  06/26/2022 3.9  12/19/2012 3.8   Magnesium (mg/dL)  Date Value  16/12/9602 2.3   Calcium (mg/dL)  Date Value  54/10/8117 8.1 (L)   Calcium, Total (mg/dL)  Date Value  14/78/2956 9.5   Albumin (g/dL)  Date Value  21/30/8657 2.9 (L)  12/19/2012 3.8   Phosphorus (mg/dL)  Date Value  84/69/6295 2.3 (L)   Sodium (mmol/L)  Date Value  06/26/2022 144  12/19/2012 137    Assessment: 57 year old female admitted with DKA and acute pancreatitis. PMH includes Stage 3a CKD, HLD, HTN, Morbid Obesity, OSA (CPAP qhs), Type II Diabetes Mellitus.  Anion gap closed. IV insulin transitioned off. CBG 170s at time of transitioning to subQ insulin  Fluids: 0.45% NaCl @125  mL/hr, tube feeds @60  mL/hr  Renal function improving and now at baseline.  Goal of Therapy:  Electrolytes within normal limits  Plan:  --Phos 1.9 > 2.3 after replacement yesterday. Will give Kphos per tube 2 packets every 4 hours x 2 doses today. --Follow up BMP, Mag, and phos tomorrow AM.   Elliot Gurney, PharmD, BCPS Clinical Pharmacist  06/26/2022 7:52 AM

## 2022-06-26 NOTE — Progress Notes (Signed)
Physical Therapy Treatment Patient Details Name: Cynthia Ayers MRN: [id number] DOB: 09-05-1965 Today's Date: 06/26/2022   History of Present Illness Cynthia Ayers is a 57 y.o. female with medical history significant of DM, HTN, HLD, morbid obesity, OSA on CPAP, depression with anxiety, CKD 3A, who presents with altered mental status. pts coworkers called EMS when the pt did not show up to work today.  When EMS arrived at pts home she was found in bed confused.They checked a blood sugar which was greater than 500. Per EDP, initially patient could not answer questions or follow commands.  Patient was given IV fluid in ED, and started on insulin drip.    PT Comments    Patient received in bed, she was on bed pan with no success. Patient rolls in bed without assistance. She is able to go from supine to sit with mod I. Patient stands from bed with min guard and ambulated 50 feet with RW and min guard/supervision. Patient ambulating with slower cadence and does not use AD at baseline. She will continue to benefit from skilled PT to improve functional independence, and strength.      Recommendations for follow up therapy are one component of a multi-disciplinary discharge planning process, led by the attending physician.  Recommendations may be updated based on patient status, additional functional criteria and insurance authorization.  Follow Up Recommendations  Can patient physically be transported by private vehicle: Yes    Assistance Recommended at Discharge Intermittent Supervision/Assistance  Patient can return home with the following A little help with walking and/or transfers;A little help with bathing/dressing/bathroom;Help with stairs or ramp for entrance;Assist for transportation   Equipment Recommendations  Rolling walker (2 wheels)    Recommendations for Other Services       Precautions / Restrictions Precautions Precautions: Fall Restrictions Weight Bearing Restrictions: No      Mobility  Bed Mobility Overal bed mobility: Modified Independent Bed Mobility: Supine to Sit     Supine to sit: Modified independent (Device/Increase time)          Transfers Overall transfer level: Needs assistance Equipment used: Rolling walker (2 wheels) Transfers: Sit to/from Stand, Bed to chair/wheelchair/BSC Sit to Stand: Min guard Stand pivot transfers: Min guard              Ambulation/Gait Ambulation/Gait assistance: Min guard Gait Distance (Feet): 50 Feet Assistive device: Rolling walker (2 wheels) Gait Pattern/deviations: Step-through pattern, Decreased step length - right, Decreased step length - left Gait velocity: decr     General Gait Details: patient reports she is feeling "weird", no lob or significant difficulty wtih ambulation   Stairs             Wheelchair Mobility    Modified Rankin (Stroke Patients Only)       Balance Overall balance assessment: Needs assistance Sitting-balance support: Feet supported Sitting balance-Leahy Scale: Good     Standing balance support: Bilateral upper extremity supported, During functional activity, Reliant on assistive device for balance Standing balance-Leahy Scale: Fair                              Cognition Arousal/Alertness: Awake/alert Behavior During Therapy: WFL for tasks assessed/performed Overall Cognitive Status: Within Functional Limits for tasks assessed  Exercises      General Comments        Pertinent Vitals/Pain Pain Assessment Pain Assessment: No/denies pain    Home Living                          Prior Function            PT Goals (current goals can now be found in the care plan section) Acute Rehab PT Goals Patient Stated Goal: to improve, return home PT Goal Formulation: With patient Time For Goal Achievement: 07/09/22 Potential to Achieve Goals: Good Progress towards PT  goals: Progressing toward goals    Frequency    Min 4X/week      PT Plan Discharge plan needs to be updated    Co-evaluation              AM-PAC PT "6 Clicks" Mobility   Outcome Measure  Help needed turning from your back to your side while in a flat bed without using bedrails?: None Help needed moving from lying on your back to sitting on the side of a flat bed without using bedrails?: None Help needed moving to and from a bed to a chair (including a wheelchair)?: A Little Help needed standing up from a chair using your arms (e.g., wheelchair or bedside chair)?: A Little Help needed to walk in hospital room?: A Little Help needed climbing 3-5 steps with a railing? : A Lot 6 Click Score: 19    End of Session   Activity Tolerance: Patient tolerated treatment well Patient left: in chair;with call bell/phone within reach Nurse Communication: Mobility status PT Visit Diagnosis: Difficulty in walking, not elsewhere classified (R26.2);Muscle weakness (generalized) (M62.81)     Time: 6045-4098 PT Time Calculation (min) (ACUTE ONLY): 28 min  Charges:  $Gait Training: 23-37 mins                     Caitriona Sundquist, PT, GCS 06/26/22,11:16 AM

## 2022-06-26 NOTE — Inpatient Diabetes Management (Signed)
Inpatient Diabetes Program Recommendations  AACE/ADA: New Consensus Statement on Inpatient Glycemic Control (2015)  Target Ranges:  Prepandial:   less than 140 mg/dL      Peak postprandial:   less than 180 mg/dL (1-2 hours)      Critically ill patients:  140 - 180 mg/dL   Lab Results  Component Value Date   GLUCAP 348 (H) 06/26/2022   HGBA1C 12.2 (H) 06/25/2022    Review of Glycemic Control  Diabetes history: DM2 Outpatient Diabetes medications: Ozempic weekly Current orders for Inpatient glycemic control: Semglee 18 units BID, Novolog -15 units TID  Spoke with patient at bedside.  She is sitting up in the chair eating her lunch.  NGT is clamped.  She states she used to take Lantus, Humalog, glipizide and Metformin in the past,  she used the [employer] 3 as well.  She started Ozempic appx 2 years ago and was able to come off of her DM medications.  Since being on Ozempic she has lost 117 lbs.  She is aware that she will likely not continue this medication as she presented with DKA and pancreatitis.  She works for the [employer] and states Lantus and Humalog are covered.  She will follow up with her PCP after discharge.  She will need a new glucometer at discharge as she has not checked her BG since coming off insulin and does not have a glucometer at home.    For discharge-please order:  Glucometer and supplies-[id number] Lantus Solostar insulin [id number] Humalog kwikpen-[id number] Insulin pen [phone] Dorathy Daft [phone]  She is confident about remembering how to administer insulins using a insulin pen.  She is aware of signs, symptoms and treatment of hypoglycemia.  Will continue to follow while inpatient.  Thank you, Dulce Sellar, MSN, CDCES Diabetes Coordinator Inpatient Diabetes Program [phone] (team pager from 8a-5p)

## 2022-06-26 NOTE — Inpatient Diabetes Management (Signed)
Inpatient Diabetes Program Recommendations  AACE/ADA: New Consensus Statement on Inpatient Glycemic Control (2015)  Target Ranges:  Prepandial:   less than 140 mg/dL      Peak postprandial:   less than 180 mg/dL (1-2 hours)      Critically ill patients:  140 - 180 mg/dL   Lab Results  Component Value Date   GLUCAP 332 (H) 06/26/2022   HGBA1C 12.2 (H) 06/25/2022    Review of Glycemic Control  Latest Reference Range & Units 06/25/22 19:26 06/25/22 23:50 06/26/22 03:24 06/26/22 07:26  Glucose-Capillary 70 - 99 mg/dL 696 (H) 295 (H) 284 (H) 332 (H)  (H): Data is abnormally high  Diabetes history: DM2 Outpatient Diabetes medications:  Current orders for Inpatient glycemic control: Novolog 0-15 units Q4H, Semglee 18 units BID, Vital @ 20 ml/hr with a goal of 60 ml/hr  Inpatient Diabetes Program Recommendations:    Might consider:  Novolog 2 units tube feed coverage Q4H.  Hold if feeds held or discontinued.  May need to titrate as feeds increase.    Will continue to follow while inpatient.  Thank you, Dulce Sellar, MSN, CDCES Diabetes Coordinator Inpatient Diabetes Program [phone] (team pager from 8a-5p)

## 2022-06-27 ENCOUNTER — Inpatient Hospital Stay: Payer: 59

## 2022-06-27 DIAGNOSIS — E1110 Type 2 diabetes mellitus with ketoacidosis without coma: Secondary | ICD-10-CM | POA: Diagnosis not present

## 2022-06-27 LAB — GLUCOSE, CAPILLARY
Glucose-Capillary: 170 mg/dL — ABNORMAL HIGH (ref 70–99)
Glucose-Capillary: 199 mg/dL — ABNORMAL HIGH (ref 70–99)
Glucose-Capillary: 254 mg/dL — ABNORMAL HIGH (ref 70–99)
Glucose-Capillary: 311 mg/dL — ABNORMAL HIGH (ref 70–99)
Glucose-Capillary: 383 mg/dL — ABNORMAL HIGH (ref 70–99)

## 2022-06-27 LAB — RENAL FUNCTION PANEL
Albumin: 2.7 g/dL — ABNORMAL LOW (ref 3.5–5.0)
Anion gap: 7 (ref 5–15)
BUN: 17 mg/dL (ref 6–20)
CO2: 22 mmol/L (ref 22–32)
Calcium: 8.3 mg/dL — ABNORMAL LOW (ref 8.9–10.3)
Chloride: 113 mmol/L — ABNORMAL HIGH (ref 98–111)
Creatinine, Ser: 0.81 mg/dL (ref 0.44–1.00)
GFR, Estimated: >60 mL/min (ref >60)
Glucose, Bld: 184 mg/dL — ABNORMAL HIGH (ref 70–99)
Phosphorus: 2.6 mg/dL (ref 2.5–4.6)
Potassium: 3.8 mmol/L (ref 3.5–5.1)
Sodium: 142 mmol/L (ref 135–145)

## 2022-06-27 LAB — CBC
HCT: 37 % (ref 36.0–46.0)
Hemoglobin: 11.8 g/dL — ABNORMAL LOW (ref 12.0–15.0)
MCH: 27.4 pg (ref 26.0–34.0)
MCHC: 31.9 g/dL (ref 30.0–36.0)
MCV: 86 fL (ref 80.0–100.0)
Platelets: 98 10*3/uL — ABNORMAL LOW (ref 150–400)
RBC: 4.3 MIL/uL (ref 3.87–5.11)
RDW: 14.3 % (ref 11.5–15.5)
WBC: 6 10*3/uL (ref 4.0–10.5)
nRBC: 0 % (ref 0.0–0.2)

## 2022-06-27 LAB — MAGNESIUM: Magnesium: 2.2 mg/dL (ref 1.7–2.4)

## 2022-06-27 MED ORDER — SODIUM CHLORIDE 0.9 % IV SOLN: INTRAVENOUS | Status: DC

## 2022-06-27 MED ORDER — MOMETASONE FURO-FORMOTEROL FUM 200-5 MCG/ACT IN AERO
2.0000 | INHALATION_SPRAY | Freq: Two times a day (BID) | RESPIRATORY_TRACT | Status: DC
  Administered 2022-06-27 – 2022-06-29 (×3): 2 via RESPIRATORY_TRACT
  Filled 2022-06-27: qty 8.8

## 2022-06-27 MED ORDER — ATORVASTATIN CALCIUM 20 MG PO TABS
20.0000 mg | ORAL_TABLET | Freq: Every day | ORAL | Status: DC
  Administered 2022-06-27 – 2022-06-28 (×2): 20 mg via ORAL
  Filled 2022-06-27 (×2): qty 1

## 2022-06-27 MED ORDER — TRAMADOL HCL 50 MG PO TABS
50.0000 mg | ORAL_TABLET | Freq: Four times a day (QID) | ORAL | Status: DC | PRN
  Administered 2022-06-27 – 2022-06-29 (×5): 50 mg via ORAL
  Filled 2022-06-27 (×5): qty 1

## 2022-06-27 MED ORDER — MODAFINIL 100 MG PO TABS
200.0000 mg | ORAL_TABLET | Freq: Every day | ORAL | Status: DC
  Administered 2022-06-28 – 2022-06-29 (×2): 200 mg via ORAL
  Filled 2022-06-27 (×3): qty 2

## 2022-06-27 MED ORDER — IOHEXOL 9 MG/ML PO SOLN
500.0000 mL | ORAL | Status: AC
  Administered 2022-06-27 (×2): 500 mL via ORAL

## 2022-06-27 MED ORDER — IOHEXOL 300 MG/ML  SOLN
100.0000 mL | Freq: Once | INTRAMUSCULAR | Status: AC | PRN
  Administered 2022-06-27: 100 mL via INTRAVENOUS

## 2022-06-27 MED ORDER — FLUCONAZOLE 50 MG PO TABS
150.0000 mg | ORAL_TABLET | Freq: Once | ORAL | Status: AC
  Administered 2022-06-27: 150 mg via ORAL
  Filled 2022-06-27: qty 1

## 2022-06-27 NOTE — Progress Notes (Signed)
Pt called for charge nurse. pt upset she was not getting items requested. Pt stated that at 5pm she asked for linen and socks because she had dripped urine down her legs; she was told "they would pass it along" during shift change. Nurse Brien Mates brought items she needed while I was speaking to pt. I apologized that she was not taken care of during day shift and during during shift change; I reassured night shift will take care of needs. Pt stated ' nothing will make her understand and that she will talk to hospitalist in the morning to leave.'

## 2022-06-27 NOTE — Plan of Care (Signed)
  Problem: Education: Goal: Ability to describe self-care measures that may prevent or decrease complications (Diabetes Survival Skills Education) will improve Outcome: Progressing   Problem: Coping: Goal: Ability to adjust to condition or change in health will improve Outcome: Progressing   Problem: Fluid Volume: Goal: Ability to maintain a balanced intake and output will improve Outcome: Progressing   Problem: Health Behavior/Discharge Planning: Goal: Ability to identify and utilize available resources and services will improve Outcome: Progressing Goal: Ability to manage health-related needs will improve Outcome: Progressing   Problem: Metabolic: Goal: Ability to maintain appropriate glucose levels will improve Outcome: Progressing   Problem: Nutritional: Goal: Maintenance of adequate nutrition will improve Outcome: Progressing Goal: Progress toward achieving an optimal weight will improve Outcome: Progressing   Problem: Skin Integrity: Goal: Risk for impaired skin integrity will decrease Outcome: Progressing   Problem: Education: Goal: Ability to describe self-care measures that may prevent or decrease complications (Diabetes Survival Skills Education) will improve Outcome: Progressing Goal: Individualized Educational Video(s) Outcome: Progressing   Problem: Cardiac: Goal: Ability to maintain an adequate cardiac output will improve Outcome: Progressing   Problem: Health Behavior/Discharge Planning: Goal: Ability to identify and utilize available resources and services will improve Outcome: Progressing Goal: Ability to manage health-related needs will improve Outcome: Progressing   Problem: Fluid Volume: Goal: Ability to achieve a balanced intake and output will improve Outcome: Progressing   Problem: Metabolic: Goal: Ability to maintain appropriate glucose levels will improve Outcome: Progressing   Problem: Nutritional: Goal: Maintenance of adequate  nutrition will improve Outcome: Progressing Goal: Maintenance of adequate weight for body size and type will improve Outcome: Progressing   Problem: Respiratory: Goal: Will regain and/or maintain adequate ventilation Outcome: Progressing   Problem: Urinary Elimination: Goal: Ability to achieve and maintain adequate renal perfusion and functioning will improve Outcome: Progressing   Problem: Education: Goal: Knowledge of General Education information will improve Description: Including pain rating scale, medication(s)/side effects and non-pharmacologic comfort measures Outcome: Progressing   Problem: Health Behavior/Discharge Planning: Goal: Ability to manage health-related needs will improve Outcome: Progressing   Problem: Clinical Measurements: Goal: Ability to maintain clinical measurements within normal limits will improve Outcome: Progressing Goal: Will remain free from infection Outcome: Progressing Goal: Diagnostic test results will improve Outcome: Progressing Goal: Respiratory complications will improve Outcome: Progressing Goal: Cardiovascular complication will be avoided Outcome: Progressing   Problem: Activity: Goal: Risk for activity intolerance will decrease Outcome: Progressing   Problem: Nutrition: Goal: Adequate nutrition will be maintained Outcome: Progressing   Problem: Coping: Goal: Level of anxiety will decrease Outcome: Progressing   Problem: Elimination: Goal: Will not experience complications related to bowel motility Outcome: Progressing Goal: Will not experience complications related to urinary retention Outcome: Progressing   Problem: Pain Managment: Goal: General experience of comfort will improve Outcome: Progressing   Problem: Safety: Goal: Ability to remain free from injury will improve Outcome: Progressing   Problem: Skin Integrity: Goal: Risk for impaired skin integrity will decrease Outcome: Progressing

## 2022-06-27 NOTE — Progress Notes (Signed)
A consult was placed to the hospital's IV Nurse for new access; pt needing IV for CT scan; pt with very poor access; veins do not compress easily with ultrasound; able to place 20ga on 2nd attempt; site just above Left AC;  blood return noted; flushed x 2 with NS (20cc); no swelling or c/o pain w flushing.  Suggest using right arm for BP measurements.

## 2022-06-27 NOTE — Progress Notes (Addendum)
PROGRESS NOTE    Cynthia Ayers  ZOX:[id number] DOB: Jun 18, 1965 DOA: 06/23/2022 PCP: Alm Bustard, NP     Brief Narrative:   From admission h and p  Cynthia Ayers is a 57 y.o. female with medical history significant of DM, HTN, HLD, morbid obesity, OSA on CPAP, depression with anxiety, CKD 3A, who presents with altered mental status.   Patient has AMS, can only provide limited medical history.  I tried to call her daughter without success, her daughter's cell phone is not set up for leaving a message. Therefore, most of the history is obtained by discussing the case with ED physician, per EMS report, and with the nursing staff.    Per report, pts coworkers called EMS when the pt did not show up to work today.  When EMS arrived at pts home she was found in bed confused.They checked a blood sugar which was greater than 500. Per EDP, initially patient could not answer questions or follow commands.  Patient was given IV fluid in ED, and started on insulin drip.  Her mental status improved slightly.  When I saw patient in ED, patient is confused about year 2024.  She knows her own name, knows that she is in the hospital.  She denies chest pain.  No active respiratory distress or cough noted.  She complains of abdominal pain, but cannot provide detailed information.  No active nausea, vomiting or diarrhea noted.  She moves all extremities. Later on, after pt is transferred to SDU, she becomes very agitated, not following command.   Later day of admission transferred to ICU with severe encephalopathy. Trialed precedex but developed bradycardia  Assessment & Plan:   Principal Problem:   DKA (diabetic ketoacidosis) (HCC) Active Problems:   Type II diabetes mellitus with renal manifestations (HCC)   Acute metabolic encephalopathy   HTN (hypertension)   HLD (hyperlipidemia)   Dehydration   Acute renal failure superimposed on stage 3a chronic kidney disease (HCC)   Elevated lactic acid level    Abdominal pain   OSA on CPAP   Morbid obesity with BMI of 45.0-49.9, adult (HCC)  # DKA # T2DM Pancreatitis likely contributory. Recent steroids also likely contributory. A1c 8.2. gap closed, last beta-h was close to normal. Now off insulin gtt. Glucose remains elevated but improving - continue semglee mealtime and SSI nsulin  # Acute metabolic encephalopathy Likely 2/2 dka, pancreatitis. CT head neg. UDS unremarkable. Now resolved - monitor  # Debility PT now advising HH with rolling walker  # Bradycardia With precedex, resolved. TTE unremarkable - monitor  # Acute pancreatitis Mild elevation of pancreatic enzymes, non-con CT showing pancreatitis. Triglycerides not significantly elevated. T bili is mildly elevated. No stones seen on CT nor stones or biliary duct dilitation seen on RUQ u/s.  Home semaglutide may have caused, would hold at d/c. No abd pain yesterday but today 5/5 - step back to clear liquids - continue IVF - repeat CT of abdomen to eval for complications  # Hypernatremia Likely 2/2 dehydration. Now resolved  # HTN Wnl today -  cont home clonidine and hydral - home spiro on hold  # Thrombocytopenia Stable in the low 100s, likely 2/2 acute illness - monitor, further w/u if persists/worsens  # Constipation Had bm yesterday - stop lactulose - cont miralax/senna  # Vaginal pruritus Recent abx for uti, suspect yeast infection - oral fluconazole x1  # AKI Baseline cr of around 1, presented with 2.25, now resolved with fluids - monitor  #  Narcolepsy? - resume home armodafinil    # Asthma - dulera for home advair  # Acute urinary retention Foley removed and now voiding spontaneously  # OSA - cpap qhs  # obesity Noted    DVT prophylaxis: lovenox Code Status: full Family Communication: daughter updated at bedside 4/27  Level of care: Med-Surg Status is: Inpatient Remains inpatient appropriate because: severity of  illness    Consultants:  pccm  Procedures: none  Antimicrobials:  none    Subjective: 5/10 mid abdominal pain, no vomiting, had bm yesterday  Objective: Vitals:   06/26/22 1400 06/26/22 1617 06/26/22 2237 06/27/22 0803  BP: 101/68 108/62 107/63 119/77  Pulse: 88 86 100 79  Resp: 16 18 18 18   Temp:  98.2 F (36.8 C) 99.5 F (37.5 C) 98.9 F (37.2 C)  TempSrc:      SpO2: 97% 99% 97% 99%  Weight:      Height:        Intake/Output Summary (Last 24 hours) at 06/27/2022 0957 Last data filed at 06/26/2022 1549 Gross per 24 hour  Intake 391.78 ml  Output 1700 ml  Net -1308.22 ml   Filed Weights   06/23/22 1415 06/25/22 0500 06/26/22 0500  Weight: 113.9 kg 117.2 kg 117.9 kg    Examination:  General exam: NAD Respiratory system: Clear to auscultation. Respiratory effort normal. Cardiovascular system: tachycardic Gastrointestinal system: Abdomen is obese, soft and mildy tender in epigastrum Central nervous system: awake but somnolent, moving all 4 Extremities: Symmetric 5 x 5 power. Trace LE edema Skin: No rashes, lesions or ulcers Psychiatry: aao x3    Data Reviewed: I have personally reviewed following labs and imaging studies  CBC: Recent Labs  Lab 06/23/22 1004 06/24/22 0338 06/25/22 0928 06/26/22 0440 06/27/22 0749  WBC 10.1 12.7* 7.6 6.5 6.0  HGB 15.5* 14.7 13.6 12.4 11.8*  HCT 49.7* 46.7* 43.9 40.1 37.0  MCV 86.3 85.7 87.3 87.9 86.0  PLT 246 202 130* 108* 98*   Basic Metabolic Panel: Recent Labs  Lab 06/23/22 2122 06/24/22 0338 06/24/22 0729 06/24/22 1345 06/25/22 0011 06/25/22 0735 06/26/22 0440 06/27/22 0749  NA 159*   < > 156* 153* 148* 148* 144 142  K 3.7   < > 3.6 4.3  --  4.4 3.9 3.8  CL 124*   < > 123* 118*  --  115* 113* 113*  CO2 28   < > 27 23  --  25 23 22   GLUCOSE 211*   < > 203* 326*  --  345* 243* 184*  BUN 55*   < > 50* 47*  --  30* 22* 17  CREATININE 1.67*   < > 1.67* 1.52*  --  1.24* 0.89 0.81  CALCIUM 9.5   < >  9.4 8.9  --  8.8* 8.1* 8.3*  MG 3.3*  --  3.0*  --   --  2.4 2.3 2.2  PHOS 2.1*  --  2.6  --   --  1.9* 2.3* 2.6   < > = values in this interval not displayed.   GFR: Estimated Creatinine Clearance: 97.4 mL/min (by C-G formula based on SCr of 0.81 mg/dL). Liver Function Tests: Recent Labs  Lab 06/23/22 1004 06/24/22 0338 06/25/22 0735 06/26/22 0440 06/27/22 0749  AST 20 19  --   --   --   ALT 21 16  --   --   --   ALKPHOS 126 101  --   --   --  BILITOT 2.0* 1.2  --   --   --   PROT 8.9* 7.3  --   --   --   ALBUMIN 4.3 3.7 3.2* 2.9* 2.7*   Recent Labs  Lab 06/23/22 1004 06/24/22 0338 06/25/22 0735  LIPASE 101* 546* 107*  AMYLASE  --  729*  --    No results for input(s): "AMMONIA" in the last 168 hours. Coagulation Profile: No results for input(s): "INR", "PROTIME" in the last 168 hours. Cardiac Enzymes: No results for input(s): "CKTOTAL", "CKMB", "CKMBINDEX", "TROPONINI" in the last 168 hours. BNP (last 3 results) No results for input(s): "PROBNP" in the last 8760 hours. HbA1C: Recent Labs    06/25/22 0011  HGBA1C 12.2*   CBG: Recent Labs  Lab 06/26/22 1128 06/26/22 1539 06/26/22 1944 06/27/22 0013 06/27/22 0433  GLUCAP 348* 298* 299* 199* 170*   Lipid Profile: No results for input(s): "CHOL", "HDL", "LDLCALC", "TRIG", "CHOLHDL", "LDLDIRECT" in the last 72 hours.  Thyroid Function Tests: No results for input(s): "TSH", "T4TOTAL", "FREET4", "T3FREE", "THYROIDAB" in the last 72 hours. Anemia Panel: No results for input(s): "VITAMINB12", "FOLATE", "FERRITIN", "TIBC", "IRON", "RETICCTPCT" in the last 72 hours. Urine analysis:    Component Value Date/Time   COLORURINE YELLOW (A) 06/23/2022 0054   APPEARANCEUR CLEAR (A) 06/23/2022 0054   APPEARANCEUR Clear 12/19/2012 0014   LABSPEC 1.029 06/23/2022 0054   LABSPEC 1.010 12/19/2012 0014   PHURINE 5.0 06/23/2022 0054   GLUCOSEU >=500 (A) 06/23/2022 0054   GLUCOSEU Negative 12/19/2012 0014   HGBUR NEGATIVE  06/23/2022 0054   BILIRUBINUR NEGATIVE 06/23/2022 0054   BILIRUBINUR Negative 12/19/2012 0014   KETONESUR 20 (A) 06/23/2022 0054   PROTEINUR NEGATIVE 06/23/2022 0054   NITRITE NEGATIVE 06/23/2022 0054   LEUKOCYTESUR NEGATIVE 06/23/2022 0054   LEUKOCYTESUR 1+ 12/19/2012 0014   Sepsis Labs: @LABRCNTIP (procalcitonin:4,lacticidven:4)  ) Recent Results (from the past 240 hour(s))  MRSA Next Gen by PCR, Nasal     Status: None   Collection Time: 06/23/22  2:37 PM   Specimen: Nasal Mucosa; Nasal Swab  Result Value Ref Range Status   MRSA by PCR Next Gen NOT DETECTED NOT DETECTED Final    Comment: (NOTE) The GeneXpert MRSA Assay (FDA approved for NASAL specimens only), is one component of a comprehensive MRSA colonization surveillance program. It is not intended to diagnose MRSA infection nor to guide or monitor treatment for MRSA infections. Test performance is not FDA approved in patients less than 64 years old. Performed at [facility], [address] [place], [place] [address]          Radiology Studies: DG Naso/Oro Gtube Thru Duo-Reposition  Result Date: 06/25/2022 INDICATION: Provided history: On tube feeding diet. Request received for enteric tube advancement to a post-pyloric position. EXAM: ENTERIC TUBE ADVANCEMENT UNDER FLUOROSCOPY. MEDICATIONS: None. ANESTHESIA/SEDATION: None. CONTRAST:  None. FLUOROSCOPY: Fluoroscopy time: 48 seconds (25.2 mGy). COMPLICATIONS: None immediate. PROCEDURE: The patient was transported to the fluoroscopy suite with a nasoenteric tube present, terminating at the level of the gastric antrum. Under intermittent fluoroscopic guidance, the nasoenteric tube was advanced as far as could be achieved. At procedure termination, the tip of the enteric tube was located in the expected location of the second portion of the duodenum. The tube was secured at the nose with adhesive. IMPRESSION: 1. Nasoenteric tube advancement under fluoroscopic  guidance. 2. At procedure termination, the tip of the enteric tube is located in the expected location of the second portion of the duodenum. The tube was  secured at the nose with adhesive. Electronically Signed   By: Jackey Loge D.O.   On: 06/25/2022 15:01        Scheduled Meds:  atorvastatin  20 mg Per Tube QHS   Chlorhexidine Gluconate Cloth  6 each Topical Daily   enoxaparin (LOVENOX) injection  0.5 mg/kg Subcutaneous Q24H   famotidine  20 mg Oral Daily   feeding supplement (GLUCERNA SHAKE)  237 mL Oral TID BM   folic acid  1 mg Intravenous Daily   insulin aspart  0-15 Units Subcutaneous Q4H   insulin glargine-yfgn  18 Units Subcutaneous BID   lactulose  20 g Oral Daily   megestrol  40 mg Oral Daily   montelukast  10 mg Oral QHS   multivitamin with minerals  1 tablet Oral Daily   mouth rinse  15 mL Mouth Rinse 4 times per day   polyethylene glycol  34 g Oral Daily   senna  1 tablet Oral Daily   thiamine (VITAMIN B1) injection  100 mg Intravenous Daily   Continuous Infusions:  sodium chloride       LOS: 4 days     Silvano Bilis, MD [facility]   If 7PM-7AM, please contact night-coverage [email or web address] Password [facility] 06/27/2022, 9:57 AM

## 2022-06-27 NOTE — Progress Notes (Signed)
Physical Therapy Treatment Patient Details Name: Cynthia Ayers MRN: [id number] DOB: Mar 29, 1965 Today's Date: 06/27/2022   History of Present Illness Cynthia Ayers is a 57 y.o. female with medical history significant of DM, HTN, HLD, morbid obesity, OSA on CPAP, depression with anxiety, CKD 3A, who presents with altered mental status. pts coworkers called EMS when the pt did not show up to work today.  When EMS arrived at pts home she was found in bed confused.They checked a blood sugar which was greater than 500. Per EDP, initially patient could not answer questions or follow commands.  Patient was given IV fluid in ED, and started on insulin drip.    PT Comments    Pt was su[pine in bed upon arrival. She is A and O x 4. Agrees to OOB activity and working on drinker contrast for CT later this date. Pt demonstrated safe abilities to exit bed, stand to RW, and ambulate ~ 120 ft. Does have slow cautious gait but steady. Recommend RW prior to DC. She will continue to benefit from skilled PT at DC to maximize independence and safety with all ADLs.     Recommendations for follow up therapy are one component of a multi-disciplinary discharge planning process, led by the attending physician.  Recommendations may be updated based on patient status, additional functional criteria and insurance authorization.     Assistance Recommended at Discharge PRN  Patient can return home with the following A little help with walking and/or transfers;A little help with bathing/dressing/bathroom;Help with stairs or ramp for entrance;Assist for transportation   Equipment Recommendations  Rolling walker (2 wheels)       Precautions / Restrictions Precautions Precautions: Fall Restrictions Weight Bearing Restrictions: No     Mobility  Bed Mobility Overal bed mobility: Modified Independent   Transfers Overall transfer level: Needs assistance Equipment used: Rolling walker (2 wheels) Transfers: Sit to/from Stand,  Bed to chair/wheelchair/BSC Sit to Stand: Supervision   Ambulation/Gait Ambulation/Gait assistance: Supervision Gait Distance (Feet): 120 Feet Assistive device: Rolling walker (2 wheels) Gait Pattern/deviations: Step-through pattern, Decreased step length - right, Decreased step length - left Gait velocity: decr     General Gait Details: no LOB. pt does endorse fatigue however was safe throughout. slow cadence noted      Balance Overall balance assessment: Needs assistance Sitting-balance support: Feet supported Sitting balance-Leahy Scale: Good     Standing balance support: Bilateral upper extremity supported, During functional activity, Reliant on assistive device for balance Standing balance-Leahy Scale: Good       Cognition Arousal/Alertness: Awake/alert Behavior During Therapy: WFL for tasks assessed/performed Overall Cognitive Status: Within Functional Limits for tasks assessed    General Comments: pt is A and O x 4. cooperative and pleasant               Pertinent Vitals/Pain Pain Assessment Pain Assessment: No/denies pain Faces Pain Scale: No hurt     PT Goals (current goals can now be found in the care plan section) Acute Rehab PT Goals Patient Stated Goal: return home tomorrow Progress towards PT goals: Progressing toward goals    Frequency    Min 4X/week      PT Plan Current plan remains appropriate    Co-evaluation     PT goals addressed during session: Mobility/safety with mobility;Balance;Proper use of DME;Strengthening/ROM        AM-PAC PT "6 Clicks" Mobility   Outcome Measure  Help needed turning from your back to your side while in a flat bed  without using bedrails?: None Help needed moving from lying on your back to sitting on the side of a flat bed without using bedrails?: None Help needed moving to and from a bed to a chair (including a wheelchair)?: A Little Help needed standing up from a chair using your arms (e.g.,  wheelchair or bedside chair)?: A Little Help needed to walk in hospital room?: A Little Help needed climbing 3-5 steps with a railing? : A Little 6 Click Score: 20    End of Session   Activity Tolerance: Patient tolerated treatment well Patient left: in chair;with call bell/phone within reach Nurse Communication: Mobility status PT Visit Diagnosis: Difficulty in walking, not elsewhere classified (R26.2);Muscle weakness (generalized) (M62.81)     Time: 9629-5284 PT Time Calculation (min) (ACUTE ONLY): 24 min  Charges:  $Gait Training: 8-22 mins $Therapeutic Activity: 8-22 mins                    Jetta Lout PTA 06/27/22, 2:46 PM

## 2022-06-28 ENCOUNTER — Inpatient Hospital Stay: Payer: 59

## 2022-06-28 DIAGNOSIS — E1110 Type 2 diabetes mellitus with ketoacidosis without coma: Secondary | ICD-10-CM | POA: Diagnosis not present

## 2022-06-28 LAB — GLUCOSE, CAPILLARY
Glucose-Capillary: 204 mg/dL — ABNORMAL HIGH (ref 70–99)
Glucose-Capillary: 212 mg/dL — ABNORMAL HIGH (ref 70–99)
Glucose-Capillary: 213 mg/dL — ABNORMAL HIGH (ref 70–99)
Glucose-Capillary: 221 mg/dL — ABNORMAL HIGH (ref 70–99)
Glucose-Capillary: 221 mg/dL — ABNORMAL HIGH (ref 70–99)
Glucose-Capillary: 341 mg/dL — ABNORMAL HIGH (ref 70–99)
Glucose-Capillary: 372 mg/dL — ABNORMAL HIGH (ref 70–99)

## 2022-06-28 LAB — RENAL FUNCTION PANEL
Albumin: 3.1 g/dL — ABNORMAL LOW (ref 3.5–5.0)
Anion gap: 8 (ref 5–15)
BUN: 10 mg/dL (ref 6–20)
CO2: 21 mmol/L — ABNORMAL LOW (ref 22–32)
Calcium: 8.8 mg/dL — ABNORMAL LOW (ref 8.9–10.3)
Chloride: 108 mmol/L (ref 98–111)
Creatinine, Ser: 0.77 mg/dL (ref 0.44–1.00)
GFR, Estimated: >60 mL/min
Glucose, Bld: 228 mg/dL — ABNORMAL HIGH (ref 70–99)
Phosphorus: 3 mg/dL (ref 2.5–4.6)
Potassium: 4 mmol/L (ref 3.5–5.1)
Sodium: 137 mmol/L (ref 135–145)

## 2022-06-28 LAB — CBC
HCT: 37.8 % (ref 36.0–46.0)
Hemoglobin: 12.1 g/dL (ref 12.0–15.0)
MCH: 27.3 pg (ref 26.0–34.0)
MCHC: 32 g/dL (ref 30.0–36.0)
MCV: 85.3 fL (ref 80.0–100.0)
Platelets: 113 K/uL — ABNORMAL LOW (ref 150–400)
RBC: 4.43 MIL/uL (ref 3.87–5.11)
RDW: 13.9 % (ref 11.5–15.5)
WBC: 7.4 K/uL (ref 4.0–10.5)
nRBC: 0 % (ref 0.0–0.2)

## 2022-06-28 NOTE — Progress Notes (Signed)
OT Cancellation Note  Patient Details Name: Cynthia Ayers MRN: [id number] DOB: August 03, 1965   Cancelled Treatment:    Reason Eval/Treat Not Completed: Pain limiting ability to participate;Other (comment). Pt endorsed 8/10 foot pain at rest (RN aware and just gave pt pain meds). Per chart, pt had R foot x-ray done earlier today and still waiting results. Will re-attempt at later date/time.  Gerrie Nordmann 06/28/2022, 3:02 PM

## 2022-06-28 NOTE — Progress Notes (Signed)
PROGRESS NOTE    Cynthia Ayers  GNF:[id number] DOB: 04-Apr-1965 DOA: 06/23/2022 PCP: Alm Bustard, NP     Brief Narrative:   From admission h and p  Cynthia Ayers is a 57 y.o. female with medical history significant of DM, HTN, HLD, morbid obesity, OSA on CPAP, depression with anxiety, CKD 3A, who presents with altered mental status.   Patient has AMS, can only provide limited medical history.  I tried to call her daughter without success, her daughter's cell phone is not set up for leaving a message. Therefore, most of the history is obtained by discussing the case with ED physician, per EMS report, and with the nursing staff.    Per report, pts coworkers called EMS when the pt did not show up to work today.  When EMS arrived at pts home she was found in bed confused.They checked a blood sugar which was greater than 500. Per EDP, initially patient could not answer questions or follow commands.  Patient was given IV fluid in ED, and started on insulin drip.  Her mental status improved slightly.  When I saw patient in ED, patient is confused about year 2024.  She knows her own name, knows that she is in the hospital.  She denies chest pain.  No active respiratory distress or cough noted.  She complains of abdominal pain, but cannot provide detailed information.  No active nausea, vomiting or diarrhea noted.  She moves all extremities. Later on, after pt is transferred to SDU, she becomes very agitated, not following command.   Later day of admission transferred to ICU with severe encephalopathy. Trialed precedex but developed bradycardia  Assessment & Plan:   Principal Problem:   DKA (diabetic ketoacidosis) (HCC) Active Problems:   Type II diabetes mellitus with renal manifestations (HCC)   Acute metabolic encephalopathy   HTN (hypertension)   HLD (hyperlipidemia)   Dehydration   Acute renal failure superimposed on stage 3a chronic kidney disease (HCC)   Elevated lactic acid level    Abdominal pain   OSA on CPAP   Morbid obesity with BMI of 45.0-49.9, adult (HCC)  # DKA # T2DM Pancreatitis likely contributory. Recent steroids also likely contributory. A1c 8.2. Now off insulin gtt, DKA is resolved. Glucose remains elevated but improving - continue semglee mealtime and SSI nsulin  # Acute metabolic encephalopathy Likely 2/2 dka, pancreatitis. CT head neg. UDS unremarkable. Now resolved  # Debility PT now advising HH with rolling walker  # Bradycardia With precedex, resolved. TTE unremarkable - monitor  # Acute pancreatitis Mild elevation of pancreatic enzymes, non-con CT showing pancreatitis. Triglycerides not significantly elevated. T bili is mildly elevated. No stones seen on CT nor stones or biliary duct dilitation seen on RUQ u/s.  Home semaglutide may have caused, would hold at d/c. Ongoing abdominal pain, can't advance diet. Repeat CT on 4/27 shows ongoing pancreatitis but fortunately no complications - continue clears - continue IVF, will speak with nursing, needs an IV, may need a midline  # Hypernatremia Likely 2/2 dehydration. Now resolved  # Foot pain Right foot, no trauma, no swelling to suggest gout - f/u x-ray  # HTN Mild elevation today -  cont home clonidine and hydral - home spiro on hold  # Thrombocytopenia Stable in the low 100s, likely 2/2 acute illness - monitor, further w/u if persists/worsens  # Constipation Had bm 4/26 - cont miralax/senna  # Vaginal pruritus Recent abx for uti, suspect yeast infection. Received oral fluconazole x1 on  4/27  # AKI Baseline cr of around 1, presented with 2.25, now resolved with fluids - monitor  # Narcolepsy? - resumed home armodafinil    # Asthma - dulera for home advair  # Acute urinary retention Foley removed and now voiding spontaneously.   # OSA - cpap qhs  # obesity Noted    DVT prophylaxis: lovenox Code Status: full Family Communication: daughter updated at bedside  4/27  Level of care: Med-Surg Status is: Inpatient Remains inpatient appropriate because: severity of illness    Consultants:  pccm  Procedures: none  Antimicrobials:  none    Subjective: 5/10 mid abdominal pain improved to 3/10 with prn, some nausea no vomiting  Objective: Vitals:   06/26/22 2237 06/27/22 0803 06/27/22 1604 06/28/22 0738  BP: 107/63 119/77 (!) 140/78 (!) 156/82  Pulse: 100 79 72 71  Resp: 18 18 18 18   Temp: 99.5 F (37.5 C) 98.9 F (37.2 C) 98.3 F (36.8 C) 98.8 F (37.1 C)  TempSrc:  Oral    SpO2: 97% 99% 100% 97%  Weight:      Height:        Intake/Output Summary (Last 24 hours) at 06/28/2022 1053 Last data filed at 06/28/2022 1032 Gross per 24 hour  Intake 587.75 ml  Output --  Net 587.75 ml   Filed Weights   06/23/22 1415 06/25/22 0500 06/26/22 0500  Weight: 113.9 kg 117.2 kg 117.9 kg    Examination:  General exam: NAD Respiratory system: Clear to auscultation. Respiratory effort normal. Cardiovascular system: tachycardic Gastrointestinal system: Abdomen is obese, soft and mildy tender in epigastrum Central nervous system: awake but somnolent, moving all 4 Extremities: Symmetric 5 x 5 power. Trace LE edema Skin: No rashes, lesions or ulcers Psychiatry: aao x3    Data Reviewed: I have personally reviewed following labs and imaging studies  CBC: Recent Labs  Lab 06/24/22 0338 06/25/22 0928 06/26/22 0440 06/27/22 0749 06/28/22 0620  WBC 12.7* 7.6 6.5 6.0 7.4  HGB 14.7 13.6 12.4 11.8* 12.1  HCT 46.7* 43.9 40.1 37.0 37.8  MCV 85.7 87.3 87.9 86.0 85.3  PLT 202 130* 108* 98* 113*   Basic Metabolic Panel: Recent Labs  Lab 06/23/22 2122 06/24/22 0338 06/24/22 0729 06/24/22 1345 06/25/22 0011 06/25/22 0735 06/26/22 0440 06/27/22 0749 06/28/22 0620  NA 159*   < > 156* 153* 148* 148* 144 142 137  K 3.7   < > 3.6 4.3  --  4.4 3.9 3.8 4.0  CL 124*   < > 123* 118*  --  115* 113* 113* 108  CO2 28   < > 27 23  --  25 23  22  21*  GLUCOSE 211*   < > 203* 326*  --  345* 243* 184* 228*  BUN 55*   < > 50* 47*  --  30* 22* 17 10  CREATININE 1.67*   < > 1.67* 1.52*  --  1.24* 0.89 0.81 0.77  CALCIUM 9.5   < > 9.4 8.9  --  8.8* 8.1* 8.3* 8.8*  MG 3.3*  --  3.0*  --   --  2.4 2.3 2.2  --   PHOS 2.1*  --  2.6  --   --  1.9* 2.3* 2.6 3.0   < > = values in this interval not displayed.   GFR: Estimated Creatinine Clearance: 98.6 mL/min (by C-G formula based on SCr of 0.77 mg/dL). Liver Function Tests: Recent Labs  Lab 06/23/22 1004 06/24/22 0338 06/25/22 0735 06/26/22  1610 06/27/22 0749 06/28/22 0620  AST 20 19  --   --   --   --   ALT 21 16  --   --   --   --   ALKPHOS 126 101  --   --   --   --   BILITOT 2.0* 1.2  --   --   --   --   PROT 8.9* 7.3  --   --   --   --   ALBUMIN 4.3 3.7 3.2* 2.9* 2.7* 3.1*   Recent Labs  Lab 06/23/22 1004 06/24/22 0338 06/25/22 0735  LIPASE 101* 546* 107*  AMYLASE  --  729*  --    No results for input(s): "AMMONIA" in the last 168 hours. Coagulation Profile: No results for input(s): "INR", "PROTIME" in the last 168 hours. Cardiac Enzymes: No results for input(s): "CKTOTAL", "CKMB", "CKMBINDEX", "TROPONINI" in the last 168 hours. BNP (last 3 results) No results for input(s): "PROBNP" in the last 8760 hours. HbA1C: No results for input(s): "HGBA1C" in the last 72 hours.  CBG: Recent Labs  Lab 06/27/22 1752 06/27/22 2056 06/28/22 0011 06/28/22 0414 06/28/22 0756  GLUCAP 254* 383* 221* 204* 213*   Lipid Profile: No results for input(s): "CHOL", "HDL", "LDLCALC", "TRIG", "CHOLHDL", "LDLDIRECT" in the last 72 hours.  Thyroid Function Tests: No results for input(s): "TSH", "T4TOTAL", "FREET4", "T3FREE", "THYROIDAB" in the last 72 hours. Anemia Panel: No results for input(s): "VITAMINB12", "FOLATE", "FERRITIN", "TIBC", "IRON", "RETICCTPCT" in the last 72 hours. Urine analysis:    Component Value Date/Time   COLORURINE YELLOW (A) 06/23/2022 0054    APPEARANCEUR CLEAR (A) 06/23/2022 0054   APPEARANCEUR Clear 12/19/2012 0014   LABSPEC 1.029 06/23/2022 0054   LABSPEC 1.010 12/19/2012 0014   PHURINE 5.0 06/23/2022 0054   GLUCOSEU >=500 (A) 06/23/2022 0054   GLUCOSEU Negative 12/19/2012 0014   HGBUR NEGATIVE 06/23/2022 0054   BILIRUBINUR NEGATIVE 06/23/2022 0054   BILIRUBINUR Negative 12/19/2012 0014   KETONESUR 20 (A) 06/23/2022 0054   PROTEINUR NEGATIVE 06/23/2022 0054   NITRITE NEGATIVE 06/23/2022 0054   LEUKOCYTESUR NEGATIVE 06/23/2022 0054   LEUKOCYTESUR 1+ 12/19/2012 0014   Sepsis Labs: @LABRCNTIP (procalcitonin:4,lacticidven:4)  ) Recent Results (from the past 240 hour(s))  MRSA Next Gen by PCR, Nasal     Status: None   Collection Time: 06/23/22  2:37 PM   Specimen: Nasal Mucosa; Nasal Swab  Result Value Ref Range Status   MRSA by PCR Next Gen NOT DETECTED NOT DETECTED Final    Comment: (NOTE) The GeneXpert MRSA Assay (FDA approved for NASAL specimens only), is one component of a comprehensive MRSA colonization surveillance program. It is not intended to diagnose MRSA infection nor to guide or monitor treatment for MRSA infections. Test performance is not FDA approved in patients less than 23 years old. Performed at [facility], [address] [place], [place] [address]          Radiology Studies: CT ABDOMEN PELVIS W CONTRAST  Result Date: 06/27/2022 CLINICAL DATA:  Persistent pancreatitis EXAM: CT ABDOMEN AND PELVIS WITH CONTRAST TECHNIQUE: Multidetector CT imaging of the abdomen and pelvis was performed using the standard protocol following bolus administration of intravenous contrast. RADIATION DOSE REDUCTION: This exam was performed according to the departmental dose-optimization program which includes automated exposure control, adjustment of the mA and/or kV according to patient size and/or use of iterative reconstruction technique. CONTRAST:  OMNIPAQUE IOHEXOL 300 MG/ML  SOLN COMPARISON:   06/23/2022 FINDINGS: Lower chest:  No pleural or pericardial effusion. Visualized lung bases clear. Hepatobiliary: No focal liver abnormality is seen. No gallstones, gallbladder wall thickening, or biliary dilatation. Pancreas: Mild inflammatory/edematous changes about the pancreatic head and uncinate process as before. Fairly symmetric parenchymal enhancement. No peripancreatic fluid collections. No ductal dilatation. Spleen: Normal in size without focal abnormality. Adrenals/Urinary Tract: No adrenal mass. Symmetric renal parenchymal enhancement without focal lesion, hydronephrosis, or evident urolithiasis. Urinary bladder partially distended. Stomach/Bowel: Stomach is incompletely distended, unremarkable. Small bowel is nondilated, with good distal passage of the oral contrast material. Normal appendix. Moderate fecal material in the proximal colon, relatively decompressed distally, without wall thickening or other acute finding. Vascular/Lymphatic: Scattered aortoiliac calcified plaque without aneurysm. Portal vein patent. No adenopathy localized. Reproductive: Uterus and bilateral adnexa are unremarkable. Other: No ascites.  No free air. Musculoskeletal: Small paraumbilical hernia containing only mesenteric fat. Facet DJD in the lower lumbar spine. No fracture or worrisome bone lesion. IMPRESSION: 1. Persistent inflammatory/edematous changes about the pancreatic head and uncinate process, suggesting pancreatitis. No evidence of necrosis or pseudocyst. 2. Small paraumbilical hernia containing only mesenteric fat. 3.  Aortic Atherosclerosis (ICD10-I70.0). Electronically Signed   By: Corlis Leak M.D.   On: 06/27/2022 15:45        Scheduled Meds:  atorvastatin  20 mg Oral QHS   Chlorhexidine Gluconate Cloth  6 each Topical Daily   enoxaparin (LOVENOX) injection  0.5 mg/kg Subcutaneous Q24H   famotidine  20 mg Oral Daily   feeding supplement (GLUCERNA SHAKE)  237 mL Oral TID BM   folic acid  1 mg  Intravenous Daily   insulin aspart  0-15 Units Subcutaneous Q4H   insulin glargine-yfgn  18 Units Subcutaneous BID   megestrol  40 mg Oral Daily   modafinil  200 mg Oral Daily   mometasone-formoterol  2 puff Inhalation BID   montelukast  10 mg Oral QHS   multivitamin with minerals  1 tablet Oral Daily   mouth rinse  15 mL Mouth Rinse 4 times per day   polyethylene glycol  34 g Oral Daily   senna  1 tablet Oral Daily   thiamine (VITAMIN B1) injection  100 mg Intravenous Daily   Continuous Infusions:  sodium chloride Stopped (06/27/22 2058)     LOS: 5 days     Silvano Bilis, MD [facility]   If 7PM-7AM, please contact night-coverage [email or web address] Password [facility] 06/28/2022, 10:53 AM

## 2022-06-29 LAB — RENAL FUNCTION PANEL
Albumin: 3 g/dL — ABNORMAL LOW (ref 3.5–5.0)
Anion gap: 7 (ref 5–15)
BUN: 8 mg/dL (ref 6–20)
CO2: 25 mmol/L (ref 22–32)
Calcium: 8.7 mg/dL — ABNORMAL LOW (ref 8.9–10.3)
Chloride: 105 mmol/L (ref 98–111)
Creatinine, Ser: 0.67 mg/dL (ref 0.44–1.00)
GFR, Estimated: >60 mL/min (ref >60)
Glucose, Bld: 139 mg/dL — ABNORMAL HIGH (ref 70–99)
Phosphorus: 3.1 mg/dL (ref 2.5–4.6)
Potassium: 3.6 mmol/L (ref 3.5–5.1)
Sodium: 137 mmol/L (ref 135–145)

## 2022-06-29 LAB — CBC
HCT: 38.5 % (ref 36.0–46.0)
Hemoglobin: 12.6 g/dL (ref 12.0–15.0)
MCH: 27.8 pg (ref 26.0–34.0)
MCHC: 32.7 g/dL (ref 30.0–36.0)
MCV: 85 fL (ref 80.0–100.0)
Platelets: 139 10*3/uL — ABNORMAL LOW (ref 150–400)
RBC: 4.53 MIL/uL (ref 3.87–5.11)
RDW: 13.6 % (ref 11.5–15.5)
WBC: 7.9 10*3/uL (ref 4.0–10.5)
nRBC: 0 % (ref 0.0–0.2)

## 2022-06-29 LAB — GLUCOSE, CAPILLARY
Glucose-Capillary: 127 mg/dL — ABNORMAL HIGH (ref 70–99)
Glucose-Capillary: 161 mg/dL — ABNORMAL HIGH (ref 70–99)

## 2022-06-29 MED ORDER — LANCET DEVICE MISC: 1.0000 | Freq: Three times a day (TID) | 0 refills | Status: AC

## 2022-06-29 MED ORDER — CLONIDINE HCL 0.1 MG PO TABS
0.3000 mg | ORAL_TABLET | Freq: Two times a day (BID) | ORAL | Status: DC
  Administered 2022-06-29: 0.3 mg via ORAL
  Filled 2022-06-29: qty 3

## 2022-06-29 MED ORDER — BLOOD GLUCOSE TEST VI STRP: 1.0000 | ORAL_STRIP | Freq: Three times a day (TID) | 0 refills | Status: AC

## 2022-06-29 MED ORDER — BD PEN NEEDLE SHORT U/F 31G X 8 MM MISC: 1.0000 | Freq: Every day | 4 refills | Status: AC

## 2022-06-29 MED ORDER — INSULIN GLARGINE 100 UNIT/ML SOLOSTAR PEN: 30.0000 [IU] | PEN_INJECTOR | Freq: Every day | SUBCUTANEOUS | 11 refills | Status: AC

## 2022-06-29 MED ORDER — LANCETS MISC. MISC: 1.0000 | Freq: Three times a day (TID) | 0 refills | Status: AC

## 2022-06-29 MED ORDER — BLOOD GLUCOSE MONITORING SUPPL DEVI: 1.0000 | Freq: Three times a day (TID) | 0 refills | Status: AC

## 2022-06-29 NOTE — Plan of Care (Signed)
Bishop Limbo, NP throughout the night of patient's increased BP, no new orders. Unable to give IV hydralazine d/t lack of access. Patient does not have IV, day shift provider aware of difficulties getting access. Prn tramadol given for headache pain (no relief). She is still c/o left foot pain. Patient also reported not getting any of her regular BP meds, NP made aware. Report given to dayshift nurse.

## 2022-06-29 NOTE — Discharge Instructions (Signed)
30 units of insulin a night to start Check fasting sugars every morning, goal is 120-160

## 2022-06-29 NOTE — Progress Notes (Signed)
Physical Therapy Treatment Patient Details Name: Cynthia Ayers MRN: [id number] DOB: 05-10-1965 Today's Date: 06/29/2022   History of Present Illness Cynthia Ayers is a 57 y.o. female with medical history significant of DM, HTN, HLD, morbid obesity, OSA on CPAP, depression with anxiety, CKD 3A, who presents with altered mental status. pts coworkers called EMS when the pt did not show up to work today.  When EMS arrived at pts home she was found in bed confused.They checked a blood sugar which was greater than 500. Per EDP, initially patient could not answer questions or follow commands.  Patient was given IV fluid in ED, and started on insulin drip.    PT Comments    Patient alert, agreeable to PT with some encouragement, reported ongoing R foot pain (6/10 with weight bearing). She performed bed mobility with modI and transfers and ambulation with supervision and RW. Instructed in RW use to assist with off weighting RLE to aid in pain management. Ace bandage also placed on R foot. The patient would benefit from further skilled PT intervention to continue to progress towards goals.     Recommendations for follow up therapy are one component of a multi-disciplinary discharge planning process, led by the attending physician.  Recommendations may be updated based on patient status, additional functional criteria and insurance authorization.  Follow Up Recommendations  Can patient physically be transported by private vehicle: Yes    Assistance Recommended at Discharge PRN  Patient can return home with the following A little help with walking and/or transfers;A little help with bathing/dressing/bathroom;Help with stairs or ramp for entrance;Assist for transportation   Equipment Recommendations  Rolling walker (2 wheels)    Recommendations for Other Services       Precautions / Restrictions Precautions Precautions: Fall     Mobility  Bed Mobility Overal bed mobility: Modified Independent Bed  Mobility: Supine to Sit, Sit to Supine                Transfers Overall transfer level: Needs assistance Equipment used: Rolling walker (2 wheels) Transfers: Sit to/from Stand Sit to Stand: Supervision                Ambulation/Gait Ambulation/Gait assistance: Supervision Gait Distance (Feet): 70 Feet Assistive device: Rolling walker (2 wheels) Gait Pattern/deviations: Step-to pattern       General Gait Details: step to for R foot pain, instructed in RW off weighting for pain management   Stairs             Wheelchair Mobility    Modified Rankin (Stroke Patients Only)       Balance Overall balance assessment: Needs assistance Sitting-balance support: Feet supported Sitting balance-Leahy Scale: Good     Standing balance support: Bilateral upper extremity supported, During functional activity, Reliant on assistive device for balance Standing balance-Leahy Scale: Good                              Cognition Arousal/Alertness: Awake/alert Behavior During Therapy: WFL for tasks assessed/performed                                   General Comments: pt is A and O x 4. cooperative and pleasant        Exercises      General Comments        Pertinent Vitals/Pain Pain Assessment Pain Assessment: 0-10  Pain Score: 6  Pain Location: R foot with weight bearing Pain Descriptors / Indicators: Discomfort, Grimacing, Sore Pain Intervention(s): Limited activity within patient's tolerance, Monitored during session, Repositioned, Other (comment) (ace wrap)    Home Living                          Prior Function            PT Goals (current goals can now be found in the care plan section) Progress towards PT goals: Progressing toward goals    Frequency    Min 4X/week      PT Plan Current plan remains appropriate    Co-evaluation              AM-PAC PT "6 Clicks" Mobility   Outcome Measure   Help needed turning from your back to your side while in a flat bed without using bedrails?: None Help needed moving from lying on your back to sitting on the side of a flat bed without using bedrails?: None Help needed moving to and from a bed to a chair (including a wheelchair)?: A Little Help needed standing up from a chair using your arms (e.g., wheelchair or bedside chair)?: A Little Help needed to walk in hospital room?: A Little Help needed climbing 3-5 steps with a railing? : A Little 6 Click Score: 20    End of Session   Activity Tolerance: Patient tolerated treatment well Patient left: with call bell/phone within reach;in bed Nurse Communication: Mobility status PT Visit Diagnosis: Difficulty in walking, not elsewhere classified (R26.2);Muscle weakness (generalized) (M62.81);Pain Pain - Right/Left: Right Pain - part of body: Ankle and joints of foot     Time: 4098-1191 PT Time Calculation (min) (ACUTE ONLY): 17 min  Charges:  $Therapeutic Activity: 8-22 mins                     Olga Coaster PT, DPT 11:39 AM,06/29/22

## 2022-06-29 NOTE — TOC Transition Note (Signed)
Transition of Care [facility]) - CM/SW Discharge Note   Patient Details  Name: Cynthia Ayers MRN: [id number] Date of Birth: 03/04/1965  Transition [facility]) CM/SW Contact:  Garret Reddish, RN Phone Number: 06/29/2022, 12:18 PM   Clinical Narrative:   Chart reviewed.  Patient has orders for discharge today. I have spoken to patient about the recommendations for [facility]  PT and OT.  Patient will also need a rolling walker.  Patient did not have a preference for HH.  I have Amy with [facility] to accept home health referral.  I have asked [employer] to provide 2 wheeled rolling walker.    I have informed staff nurse of the above information.      Final next level of care: Home w Home Health Services Barriers to Discharge: No Barriers Identified   Patient Goals and CMS Choice CMS Medicare.gov Compare Post Acute Care list provided to:: Patient Represenative (must comment) (daughter [name]) Choice offered to / list presented to : Patient  Discharge Placement                      Patient and family notified of of transfer: 06/29/22  Discharge Plan and Services Additional resources added to the After Visit Summary for                  DME Arranged: Walker rolling DME Agency: [employer] Date DME Agency Contacted: 06/29/22 Time DME Agency Contacted: 1200 Representative spoke with at DME Agency: Barbara Cower HH Arranged: PT, OT HH Agency: [facility] Date [facility] Agency Contacted: 06/29/22 Time HH Agency Contacted: 1220 Representative spoke with at [facility] Agency: Amy  Social Determinants of Health (SDOH) Interventions SDOH Screenings   Tobacco Use: Low Risk  (06/23/2022)     Readmission Risk Interventions     No data to display

## 2022-06-29 NOTE — Progress Notes (Signed)
       CROSS COVER NOTE  NAME: Cynthia Ayers MRN: [id number] DOB : [birth year] ATTENDING PHYSICIAN: Kathrynn Running, MD    Date of Service   06/29/2022   HPI/Events of Note   Report/Request  "pt's BP is elevated at 176/69. she has no IV (provider is aware), and her PRN hydralazine is IV. Can I get something PO for her BP? "  Interventions   Assessment/Plan:  Replace IV and give PRN Hydralazine Consider restarting home PO Hydralazine and PO clonidine       To reach the provider On-Call:   7AM- 7PM see care teams to locate the attending and reach out to them via [email or web address] Password: [email or web address] 7PM-7AM contact night-coverage If you still have difficulty reaching the appropriate provider, please page the [facility] (Director on Call) for [facility] on amion for assistance  This document was prepared using Conservation officer, historic buildings and may include unintentional dictation errors.  Bishop Limbo DNP, MBA, FNP-BC, PMHNP-BC Nurse Practitioner [facility] Pager [phone]

## 2022-06-29 NOTE — Discharge Summary (Addendum)
Cynthia Ayers ZOX:[id number] DOB: 1965/10/11 DOA: 06/23/2022  PCP: Alm Bustard, NP  Admit date: 06/23/2022 Discharge date: 06/29/2022  Time spent: 40 minutes  Recommendations for Outpatient Follow-up:  Close pcp f/u  CBC at f/u    Discharge Diagnoses:  Principal Problem:   DKA (diabetic ketoacidosis) (HCC) Active Problems:   Type II diabetes mellitus with renal manifestations (HCC)   Acute metabolic encephalopathy   HTN (hypertension)   HLD (hyperlipidemia)   Dehydration   Acute renal failure superimposed on stage 3a chronic kidney disease (HCC)   Elevated lactic acid level   Abdominal pain   OSA on CPAP   Morbid obesity with BMI of 45.0-49.9, adult [facility])   Discharge Condition: improved  Diet recommendation: bland carb modified  Filed Weights   06/25/22 0500 06/26/22 0500 06/29/22 0500  Weight: 117.2 kg 117.9 kg 120 kg    History of present illness:  From admission h and p Cynthia Ayers is a 57 y.o. female with medical history significant of DM, HTN, HLD, morbid obesity, OSA on CPAP, depression with anxiety, CKD 3A, who presents with altered mental status.   Patient has AMS, can only provide limited medical history.  I tried to call her daughter without success, her daughter's cell phone is not set up for leaving a message. Therefore, most of the history is obtained by discussing the case with ED physician, per EMS report, and with the nursing staff.    Per report, pts coworkers called EMS when the pt did not show up to work today.  When EMS arrived at pts home she was found in bed confused.They checked a blood sugar which was greater than 500. Per EDP, initially patient could not answer questions or follow commands.  Patient was given IV fluid in ED, and started on insulin drip.  Her mental status improved slightly.  When I saw patient in ED, patient is confused about year 2024.  She knows her own name, knows that she is in the hospital.  She denies chest pain.  No active  respiratory distress or cough noted.  She complains of abdominal pain, but cannot provide detailed information.  No active nausea, vomiting or diarrhea noted.  She moves all extremities. Later on, after pt is transferred to SDU, she becomes very agitated, not following command.  Hospital Course:  Patient presented encephalopathic found to be in DKA and with pancreatitis. Her encephalopathy resolved with treatment of her underlying medical conditions. DKA was treated with IV insulin and fluids and resolved, transitioned to basal/bolus insulin. Her acute pancreatitis is without complication, triglycerides wnl, no stones or duct dilitation on abdominal imaging. She was treated with tube feeds for 24 hours. Her home ozempic may have been the cause of her pancreatitis and so we advise holding this. Recent steroids may have precipitated dka. Will d/c on basal (lantus) insulin and patient is aware needs close pcp f/u. She requests endo referral and so I have made that. PT advising HH PT with rolling walker, these were ordered. Patient also had hypernatremia that resolved with IV hydration, thrombocytopenia that was likely 2/2 acute illness and has been improving, vaginal pruritus that was treated with one dose of fluconazole for presumed yeast infection, AKI that resolved with fluids, and right foot pain that was evaluated with x-ray which was negative. She is now pain free and tolerating solid food.  Procedures: none   Consultations: none  Discharge Exam: Vitals:   06/29/22 0501 06/29/22 0743  BP: (!) 164/78 (!) 156/57  Pulse: 70 63  Resp:  17  Temp:  98.2 F (36.8 C)  SpO2:  100%    General: NAD Cardiovascular: RRR Respiratory: CTAB Abdomen: soft, non-tender  Discharge Instructions   Discharge Instructions     Ambulatory referral to Endocrinology   Complete by: As directed    Diet - low sodium heart healthy   Complete by: As directed    Diet Carb Modified   Complete by: As directed     Increase activity slowly   Complete by: As directed       Allergies as of 06/29/2022       Reactions   Morphine Shortness Of Breath        Medication List     STOP taking these medications    fluconazole 100 MG tablet Commonly known as: DIFLUCAN   ketorolac 10 MG tablet Commonly known as: TORADOL   meloxicam 15 MG tablet Commonly known as: MOBIC   mirabegron ER 25 MG Tb24 tablet Commonly known as: MYRBETRIQ   Mounjaro 10 MG/0.5ML Pen Generic drug: tirzepatide   oxybutynin 15 MG 24 hr tablet Commonly known as: DITROPAN XL   predniSONE 10 MG tablet Commonly known as: DELTASONE   Semaglutide (2 MG/DOSE) 8 MG/3ML Sopn       TAKE these medications    amLODipine 10 MG tablet Commonly known as: NORVASC Take 10 mg by mouth daily.   Armodafinil 250 MG tablet Take 250 mg by mouth daily. What changed: Another medication with the same name was removed. Continue taking this medication, and follow the directions you see here.   atorvastatin 20 MG tablet Commonly known as: LIPITOR Take 20 mg by mouth at bedtime.   B-D ULTRAFINE III SHORT PEN 31G X 8 MM Misc Generic drug: Insulin Pen Needle 1 each by Does not apply route daily in the afternoon.   Blood Glucose Monitoring Suppl Devi 1 each by Does not apply route in the morning, at noon, and at bedtime. May substitute to any manufacturer covered by patient's insurance.   BLOOD GLUCOSE TEST STRIPS Strp 1 each by In Vitro route in the morning, at noon, and at bedtime. May substitute to any manufacturer covered by patient's insurance.   cloNIDine 0.3 MG tablet Commonly known as: CATAPRES Take 0.3 mg by mouth 2 (two) times daily.   fluticasone-salmeterol 250-50 MCG/ACT Aepb Commonly known as: ADVAIR Inhale 1 puff into the lungs in the morning and at bedtime.   hydrALAZINE 100 MG tablet Commonly known as: APRESOLINE Take 1 tablet by mouth 3 (three) times daily.   insulin glargine 100 UNIT/ML Solostar  Pen Commonly known as: LANTUS Inject 30 Units into the skin daily.   Lancet Device Misc 1 each by Does not apply route in the morning, at noon, and at bedtime. May substitute to any manufacturer covered by patient's insurance.   Lancets Misc. Misc 1 each by Does not apply route in the morning, at noon, and at bedtime. May substitute to any manufacturer covered by patient's insurance.   magnesium oxide 400 (240 Mg) MG tablet Commonly known as: MAG-OX Take 1 tablet by mouth 2 (two) times daily.   medroxyPROGESTERone 10 MG tablet Commonly known as: PROVERA Take by mouth.   megestrol 40 MG tablet Commonly known as: MEGACE Take by mouth.   montelukast 10 MG tablet Commonly known as: SINGULAIR Take 1 tablet by mouth at bedtime.   ofloxacin 0.3 % OTIC solution Commonly known as: FLOXIN SMARTSIG:5 Drop(s) Right Ear Twice Daily  pantoprazole 40 MG tablet Commonly known as: PROTONIX Take by mouth.   potassium chloride 10 MEQ tablet Commonly known as: KLOR-CON M Take by mouth.   spironolactone 50 MG tablet Commonly known as: ALDACTONE Take 1 tablet by mouth daily.   traMADol 50 MG tablet Commonly known as: ULTRAM Take by mouth.               Durable Medical Equipment  (From admission, onward)           Start     Ordered   06/29/22 1138  DME Walker  Once       Question Answer Comment  Walker: With 5 Inch Wheels   Patient needs a walker to treat with the following condition Pancreatitis      06/29/22 1144           Allergies  Allergen Reactions   Morphine Shortness Of Breath    Follow-up Information     Fields, Lisabeth Pick, NP Follow up.   Specialty: Family Medicine Contact information: [address] [place] [address] [phone]                  The results of significant diagnostics from this hospitalization (including imaging, microbiology, ancillary and laboratory) are listed below for reference.    Significant  Diagnostic Studies: DG Foot 2 Views Right  Result Date: 06/28/2022 CLINICAL DATA:  Right foot pain. EXAM: RIGHT FOOT - 2 VIEW COMPARISON:  None Available. FINDINGS: There is no evidence of fracture or dislocation. There is no evidence of significant arthropathy. Palmar calcaneal spur noted. Moderate soft tissue swelling IMPRESSION: 1. No acute fracture or dislocation identified about the right foot. 2. Moderate soft tissue swelling. Electronically Signed   By: Ted Mcalpine M.D.   On: 06/28/2022 15:24   CT ABDOMEN PELVIS W CONTRAST  Result Date: 06/27/2022 CLINICAL DATA:  Persistent pancreatitis EXAM: CT ABDOMEN AND PELVIS WITH CONTRAST TECHNIQUE: Multidetector CT imaging of the abdomen and pelvis was performed using the standard protocol following bolus administration of intravenous contrast. RADIATION DOSE REDUCTION: This exam was performed according to the departmental dose-optimization program which includes automated exposure control, adjustment of the mA and/or kV according to patient size and/or use of iterative reconstruction technique. CONTRAST:  OMNIPAQUE IOHEXOL 300 MG/ML  SOLN COMPARISON:  06/23/2022 FINDINGS: Lower chest: No pleural or pericardial effusion. Visualized lung bases clear. Hepatobiliary: No focal liver abnormality is seen. No gallstones, gallbladder wall thickening, or biliary dilatation. Pancreas: Mild inflammatory/edematous changes about the pancreatic head and uncinate process as before. Fairly symmetric parenchymal enhancement. No peripancreatic fluid collections. No ductal dilatation. Spleen: Normal in size without focal abnormality. Adrenals/Urinary Tract: No adrenal mass. Symmetric renal parenchymal enhancement without focal lesion, hydronephrosis, or evident urolithiasis. Urinary bladder partially distended. Stomach/Bowel: Stomach is incompletely distended, unremarkable. Small bowel is nondilated, with good distal passage of the oral contrast material. Normal  appendix. Moderate fecal material in the proximal colon, relatively decompressed distally, without wall thickening or other acute finding. Vascular/Lymphatic: Scattered aortoiliac calcified plaque without aneurysm. Portal vein patent. No adenopathy localized. Reproductive: Uterus and bilateral adnexa are unremarkable. Other: No ascites.  No free air. Musculoskeletal: Small paraumbilical hernia containing only mesenteric fat. Facet DJD in the lower lumbar spine. No fracture or worrisome bone lesion. IMPRESSION: 1. Persistent inflammatory/edematous changes about the pancreatic head and uncinate process, suggesting pancreatitis. No evidence of necrosis or pseudocyst. 2. Small paraumbilical hernia containing only mesenteric fat. 3.  Aortic Atherosclerosis (ICD10-I70.0). Electronically Signed  By: Corlis Leak M.D.   On: 06/27/2022 15:45   DG Naso/[place] Chase Thru Duo-Reposition  Result Date: 06/25/2022 INDICATION: Provided history: On tube feeding diet. Request received for enteric tube advancement to a post-pyloric position. EXAM: ENTERIC TUBE ADVANCEMENT UNDER FLUOROSCOPY. MEDICATIONS: None. ANESTHESIA/SEDATION: None. CONTRAST:  None. FLUOROSCOPY: Fluoroscopy time: 48 seconds (25.2 mGy). COMPLICATIONS: None immediate. PROCEDURE: The patient was transported to the fluoroscopy suite with a nasoenteric tube present, terminating at the level of the gastric antrum. Under intermittent fluoroscopic guidance, the nasoenteric tube was advanced as far as could be achieved. At procedure termination, the tip of the enteric tube was located in the expected location of the second portion of the duodenum. The tube was secured at the nose with adhesive. IMPRESSION: 1. Nasoenteric tube advancement under fluoroscopic guidance. 2. At procedure termination, the tip of the enteric tube is located in the expected location of the second portion of the duodenum. The tube was secured at the nose with adhesive. Electronically Signed   By:  Jackey Loge D.O.   On: 06/25/2022 15:01   DG Abd Portable 1V  Result Date: 06/24/2022 CLINICAL DATA:  NG tube placement EXAM: PORTABLE ABDOMEN - 1 VIEW COMPARISON:  Study done earlier today FINDINGS: Bowel gas pattern is nonspecific. Moderate to large amount of stool is seen in colon. Tip of NG tube is seen in the region of the antrum of the stomach. Coiling of NG tubes seen within the stomach is less prominent in comparison with the previous study. IMPRESSION: Tip of NG tube is seen in the region of the antrum of the stomach. Electronically Signed   By: Ernie Avena M.D.   On: 06/24/2022 19:19   ECHOCARDIOGRAM COMPLETE  Result Date: 06/24/2022    ECHOCARDIOGRAM REPORT   Patient Name:   WAVE CALZADA Date of Exam: 06/24/2022 Medical Rec #:  [id number]    Height:       63.0 in Accession #:    [id number]   Weight:       251.1 lb Date of Birth:  07/29/1965     BSA:          2.130 m Patient Age:    57 years     BP:           146/81 mmHg Patient Gender: F            HR:           113 bpm. Exam Location:  [facility] Procedure: 2D Echo, Cardiac Doppler and Color Doppler Indications:     Cardiomyopathy  History:         Patient has no prior history of Echocardiogram examinations.                  Cardiomyopathy; Risk Factors:Hypertension, Diabetes and                  Dyslipidemia.  Sonographer:     Mikki Harbor Referring Phys:  [id number] BRITTON L R[place] Diagnosing Phys: Debbe Odea MD  Sonographer Comments: Patient is obese. Image acquisition challenging due to respiratory motion. IMPRESSIONS  1. Left ventricular ejection fraction, by estimation, is 60 to 65%. The left ventricle has normal function. The left ventricle has no regional wall motion abnormalities. Left ventricular diastolic parameters are consistent with Grade I diastolic dysfunction (impaired relaxation).  2. Right ventricular systolic function is normal. The right ventricular size is normal.  3. The mitral valve is normal in structure.  No evidence of mitral valve  regurgitation.  4. The aortic valve is tricuspid. Aortic valve regurgitation is not visualized.  5. The inferior vena cava is normal in size with greater than 50% respiratory variability, suggesting right atrial pressure of 3 mmHg. FINDINGS  Left Ventricle: Left ventricular ejection fraction, by estimation, is 60 to 65%. The left ventricle has normal function. The left ventricle has no regional wall motion abnormalities. The left ventricular internal cavity size was normal in size. There is  no left ventricular hypertrophy. Left ventricular diastolic parameters are consistent with Grade I diastolic dysfunction (impaired relaxation). Right Ventricle: The right ventricular size is normal. No increase in right ventricular wall thickness. Right ventricular systolic function is normal. Left Atrium: Left atrial size was normal in size. Right Atrium: Right atrial size was normal in size. Pericardium: There is no evidence of pericardial effusion. Mitral Valve: The mitral valve is normal in structure. No evidence of mitral valve regurgitation. MV peak gradient, 6.9 mmHg. The mean mitral valve gradient is 2.0 mmHg. Tricuspid Valve: The tricuspid valve is normal in structure. Tricuspid valve regurgitation is mild. Aortic Valve: The aortic valve is tricuspid. Aortic valve regurgitation is not visualized. Aortic valve mean gradient measures 5.0 mmHg. Aortic valve peak gradient measures 10.4 mmHg. Aortic valve area, by VTI measures 2.96 cm. Pulmonic Valve: The pulmonic valve was normal in structure. Pulmonic valve regurgitation is not visualized. Aorta: The aortic root is normal in size and structure. Venous: The inferior vena cava is normal in size with greater than 50% respiratory variability, suggesting right atrial pressure of 3 mmHg. IAS/Shunts: No atrial level shunt detected by color flow Doppler.  LEFT VENTRICLE PLAX 2D LVIDd:         3.50 cm   Diastology LVIDs:         2.30 cm   LV e' medial:     8.05 cm/s LV PW:         1.10 cm   LV E/e' medial:  9.0 LV IVS:        1.20 cm   LV e' lateral:   13.80 cm/s LVOT diam:     2.00 cm   LV E/e' lateral: 5.3 LV SV:         74 LV SV Index:   35 LVOT Area:     3.14 cm  RIGHT VENTRICLE RV Basal diam:  3.35 cm RV Mid diam:    3.30 cm RV S prime:     27.00 cm/s TAPSE (M-mode): 2.8 cm LEFT ATRIUM             Index        RIGHT ATRIUM           Index LA diam:        3.00 cm 1.41 cm/m   RA Area:     17.80 cm LA Vol (A2C):   33.3 ml 15.63 ml/m  RA Volume:   53.10 ml  24.93 ml/m LA Vol (A4C):   30.1 ml 14.13 ml/m LA Biplane Vol: 34.4 ml 16.15 ml/m  AORTIC VALVE                     PULMONIC VALVE AV Area (Vmax):    2.83 cm      PV Vmax:       1.56 m/s AV Area (Vmean):   2.69 cm      PV Peak grad:  9.7 mmHg AV Area (VTI):     2.96 cm AV Vmax:  161.00 cm/s AV Vmean:          105.000 cm/s AV VTI:            0.248 m AV Peak Grad:      10.4 mmHg AV Mean Grad:      5.0 mmHg LVOT Vmax:         145.00 cm/s LVOT Vmean:        90.000 cm/s LVOT VTI:          0.234 m LVOT/AV VTI ratio: 0.94  AORTA Ao Root diam: 3.50 cm MITRAL VALVE MV Area (PHT): 4.01 cm     SHUNTS MV Area VTI:   3.67 cm     Systemic VTI:  0.23 m MV Peak grad:  6.9 mmHg     Systemic Diam: 2.00 cm MV Mean grad:  2.0 mmHg MV Vmax:       1.31 m/s MV Vmean:      66.6 cm/s MV Decel Time: 189 msec MV E velocity: 72.80 cm/s MV A velocity: 121.00 cm/s MV E/A ratio:  0.60 Debbe Odea MD Electronically signed by Debbe Odea MD Signature Date/Time: 06/24/2022/4:41:01 PM    Final    US Abdomen Limited RUQ (LIVER/GB)  Result Date: 06/24/2022 CLINICAL DATA:  Pancreatitis EXAM: ULTRASOUND ABDOMEN LIMITED RIGHT UPPER QUADRANT COMPARISON:  CT abdomen/pelvis 1 day prior, right upper quadrant ultrasound 04/19/2020 FINDINGS: Gallbladder: No gallstones or wall thickening visualized. No sonographic Murphy sign noted by sonographer. Common bile duct: Diameter: 5 mm Liver: No focal lesion identified. Within  normal limits in parenchymal echogenicity. Portal vein is patent on color Doppler imaging with normal direction of blood flow towards the liver. Other: None. IMPRESSION: Normal right upper quadrant ultrasound. Electronically Signed   By: Lesia Hausen M.D.   On: 06/24/2022 10:42   DG Abd 1 View  Result Date: 06/24/2022 CLINICAL DATA:  Encounter for orogastric (OG) tube placement [id number] EXAM: ABDOMEN - 1 VIEW COMPARISON:  06/23/2022 FINDINGS: A feeding tube is looped on itself in the stomach. The tip is positioned in the mid stomach directed towards the pylorus. Stable nonspecific bowel gas pattern. IMPRESSION: Feeding tube tip is positioned in the mid stomach. Electronically Signed   By: Kennith Center M.D.   On: 06/24/2022 07:41   DG Abd Portable 1V  Result Date: 06/23/2022 CLINICAL DATA:  Nasogastric tube placement EXAM: PORTABLE ABDOMEN - 1 VIEW COMPARISON:  CT 06/23/2022 FINDINGS: An enteric feeding tube has been placed. The tube is coiled in the left upper quadrant with tip projecting over the left upper quadrant. This is consistent with location in the body of the stomach. Visualized bowel gas pattern is normal. Lung bases are clear. IMPRESSION: Enteric feeding tube tip is in the left upper quadrant consistent with location in the body of the stomach. Electronically Signed   By: Burman Nieves M.D.   On: 06/23/2022 23:56   CT ABDOMEN PELVIS WO CONTRAST  Result Date: 06/23/2022 CLINICAL DATA:  Abdominal pain, acute, nonlocalized EXAM: CT ABDOMEN AND PELVIS WITHOUT CONTRAST TECHNIQUE: Multidetector CT imaging of the abdomen and pelvis was performed following the standard protocol without IV contrast. RADIATION DOSE REDUCTION: This exam was performed according to the departmental dose-optimization program which includes automated exposure control, adjustment of the mA and/or kV according to patient size and/or use of iterative reconstruction technique. COMPARISON:  None Available. FINDINGS: Lower  chest: Mild bibasilar atelectasis. Moderate coronary artery calcification. Global cardiac size within normal limits. No acute abnormality. Hepatobiliary: No focal liver abnormality  is seen. No gallstones, gallbladder wall thickening, or biliary dilatation. Pancreas: There is thickening of the pancreatic head with mild peripancreatic inflammatory stranding best appreciated surrounding the pancreatic head and within the pancreaticoduodenal groove in keeping with changes of acute pancreatitis. Viability of the pancreatic parenchyma is not well assessed on this noncontrast examination. No peripancreatic fluid collections are identified. The pancreatic duct is not dilated. Spleen: Unremarkable Adrenals/Urinary Tract: The adrenal glands are unremarkable. Kidneys are normal on this noncontrast examination. Foley catheter balloon is seen within a decompressed bladder lumen. Stomach/Bowel: Moderate colonic stool burden without evidence of obstruction. Stomach, small bowel, and large bowel are otherwise unremarkable. Appendix normal. No free intraperitoneal gas or fluid. Vascular/Lymphatic: Aortic atherosclerosis. No enlarged abdominal or pelvic lymph nodes. Reproductive: Uterus and bilateral adnexa are unremarkable. Other: No abdominal wall hernia or abnormality. No abdominopelvic ascites. Musculoskeletal: Degenerative changes seen within the lumbar spine. No acute bone abnormality. No lytic or blastic bone lesion. IMPRESSION: 1. Acute pancreatitis. Pancreatic parenchymal viability is not well assessed on this noncontrast examination. No peripancreatic fluid collections identified. 2. Moderate coronary artery calcification. 3. Moderate colonic stool burden without evidence of obstruction. 4. Aortic atherosclerosis. Aortic Atherosclerosis (ICD10-I70.0). Electronically Signed   By: Helyn Numbers M.D.   On: 06/23/2022 21:39   CT HEAD WO CONTRAST ( )  Result Date: 06/23/2022 CLINICAL DATA:  Mental status change, unknown  cause EXAM: CT HEAD WITHOUT CONTRAST TECHNIQUE: Contiguous axial images were obtained from the base of the skull through the vertex without intravenous contrast. RADIATION DOSE REDUCTION: This exam was performed according to the departmental dose-optimization program which includes automated exposure control, adjustment of the mA and/or kV according to patient size and/or use of iterative reconstruction technique. COMPARISON:  11/17/2010 FINDINGS: Brain: No acute intracranial abnormality. Specifically, no hemorrhage, hydrocephalus, mass lesion, acute infarction, or significant intracranial injury. Vascular: No hyperdense vessel or unexpected calcification. Skull: No acute calvarial abnormality. Sinuses/Orbits: No acute findings Other: None IMPRESSION: No acute intracranial abnormality. Electronically Signed   By: Charlett Nose M.D.   On: 06/23/2022 20:21    Microbiology: Recent Results (from the past 240 hour(s))  MRSA Next Gen by PCR, Nasal     Status: None   Collection Time: 06/23/22  2:37 PM   Specimen: Nasal Mucosa; Nasal Swab  Result Value Ref Range Status   MRSA by PCR Next Gen NOT DETECTED NOT DETECTED Final    Comment: (NOTE) The GeneXpert MRSA Assay (FDA approved for NASAL specimens only), is one component of a comprehensive MRSA colonization surveillance program. It is not intended to diagnose MRSA infection nor to guide or monitor treatment for MRSA infections. Test performance is not FDA approved in patients less than 63 years old. Performed at [facility], [address] [place], [place] [address]      Labs: Basic Metabolic Panel: Recent Labs  Lab 06/23/22 2122 06/24/22 5784 06/24/22 0729 06/24/22 1345 06/25/22 0735 06/26/22 0440 06/27/22 0749 06/28/22 0620 06/29/22 0459  NA 159*   < > 156*   < > 148* 144 142 137 137  K 3.7   < > 3.6   < > 4.4 3.9 3.8 4.0 3.6  CL 124*   < > 123*   < > 115* 113* 113* 108 105  CO2 28   < > 27   < > 25 23 22  21* 25  GLUCOSE  211*   < > 203*   < > 345* 243* 184* 228* 139*  BUN 55*   < >  50*   < > 30* 22* 17 10 8   CREATININE 1.67*   < > 1.67*   < > 1.24* 0.89 0.81 0.77 0.67  CALCIUM 9.5   < > 9.4   < > 8.8* 8.1* 8.3* 8.8* 8.7*  MG 3.3*  --  3.0*  --  2.4 2.3 2.2  --   --   PHOS 2.1*  --  2.6  --  1.9* 2.3* 2.6 3.0 3.1   < > = values in this interval not displayed.   Liver Function Tests: Recent Labs  Lab 06/23/22 1004 06/24/22 0338 06/25/22 0735 06/26/22 0440 06/27/22 0749 06/28/22 0620 06/29/22 0459  AST 20 19  --   --   --   --   --   ALT 21 16  --   --   --   --   --   ALKPHOS 126 101  --   --   --   --   --   BILITOT 2.0* 1.2  --   --   --   --   --   PROT 8.9* 7.3  --   --   --   --   --   ALBUMIN 4.3 3.7 3.2* 2.9* 2.7* 3.1* 3.0*   Recent Labs  Lab 06/23/22 1004 06/24/22 0338 06/25/22 0735  LIPASE 101* 546* 107*  AMYLASE  --  729*  --    No results for input(s): "AMMONIA" in the last 168 hours. CBC: Recent Labs  Lab 06/25/22 0928 06/26/22 0440 06/27/22 0749 06/28/22 0620 06/29/22 0459  WBC 7.6 6.5 6.0 7.4 7.9  HGB 13.6 12.4 11.8* 12.1 12.6  HCT 43.9 40.1 37.0 37.8 38.5  MCV 87.3 87.9 86.0 85.3 85.0  PLT 130* 108* 98* 113* 139*   Cardiac Enzymes: No results for input(s): "CKTOTAL", "CKMB", "CKMBINDEX", "TROPONINI" in the last 168 hours. BNP: BNP (last 3 results) No results for input(s): "BNP" in the last 8760 hours.  ProBNP (last 3 results) No results for input(s): "PROBNP" in the last 8760 hours.  CBG: Recent Labs  Lab 06/28/22 1627 06/28/22 2049 06/28/22 2330 06/29/22 0430 06/29/22 0755  GLUCAP 221* 341* 212* 127* 161*       Signed:  Silvano Bilis MD.  [facility] 06/29/2022, 11:55 AM

## 2022-06-29 NOTE — Progress Notes (Signed)
OT Cancellation Note  Patient Details Name: Cynthia Ayers MRN: [id number] DOB: Jun 13, 1965   Cancelled Treatment:    Reason Eval/Treat Not Completed: Pain limiting ability to participate;Fatigue/lethargy limiting ability to participate. Checked with RN prior to session who reported just giving pt pain meds. Pt supine in bed upon arrival. Pt endorsed 6/10 L foot pain and fatigue from not sleeping well last night due to headache. Pt deferred all therapeutic intervention at this time. Will re-attempt as able.    Dorene Grebe  [facility] 06/29/2022, 11:04 AM

## 2022-08-06 IMAGING — US US ABDOMEN LIMITED RUQ/ASCITES
1 series · 14 of 25 positions shown · non-contrast
Comparison: None.

CLINICAL DATA: Elevated liver enzymes

EXAM:
ULTRASOUND ABDOMEN LIMITED RIGHT UPPER QUADRANT

[Series 1: us abdomen limited ruq (liver/gb) · 14 of 45 slices shown]
[im 1/45]
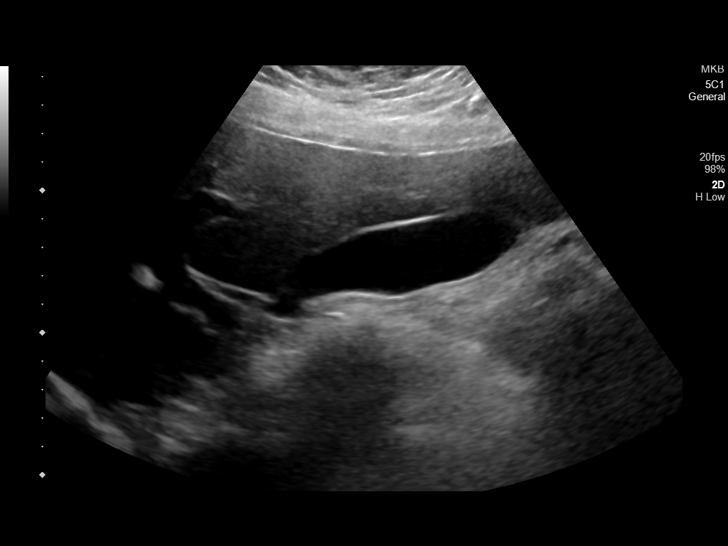
[im 4/45]
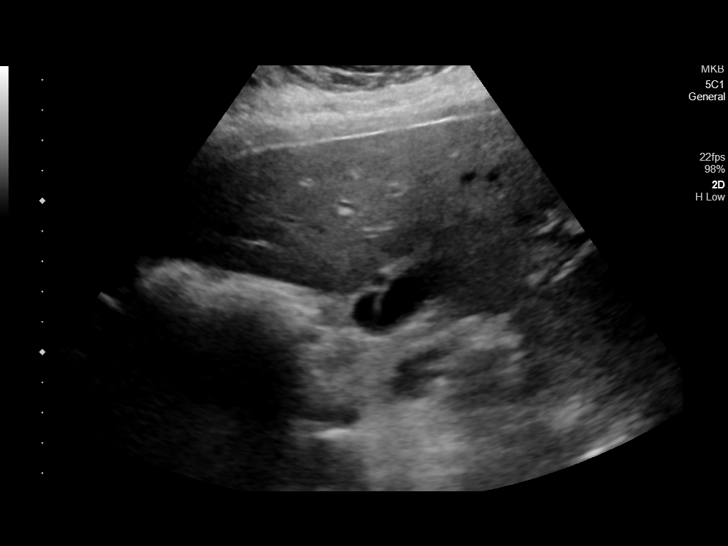
[im 8/45]
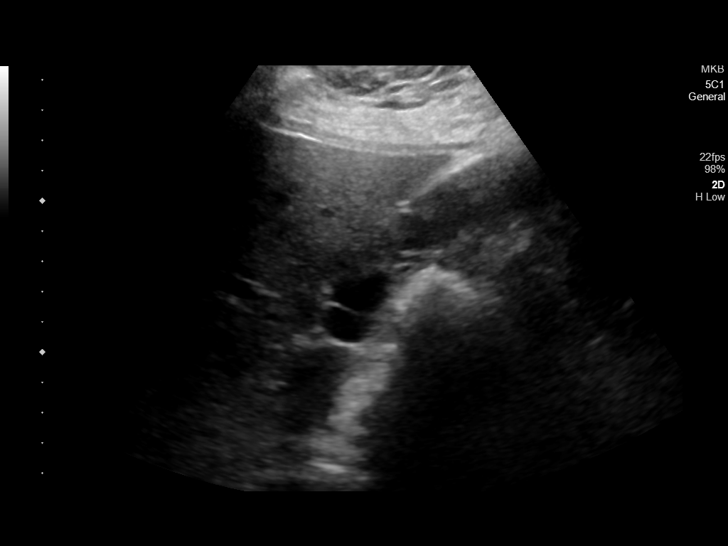
[im 12/45]
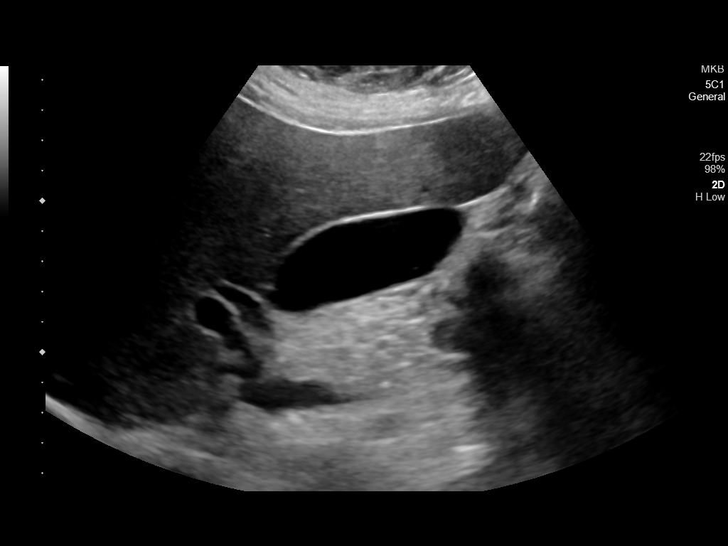
[im 15/45]
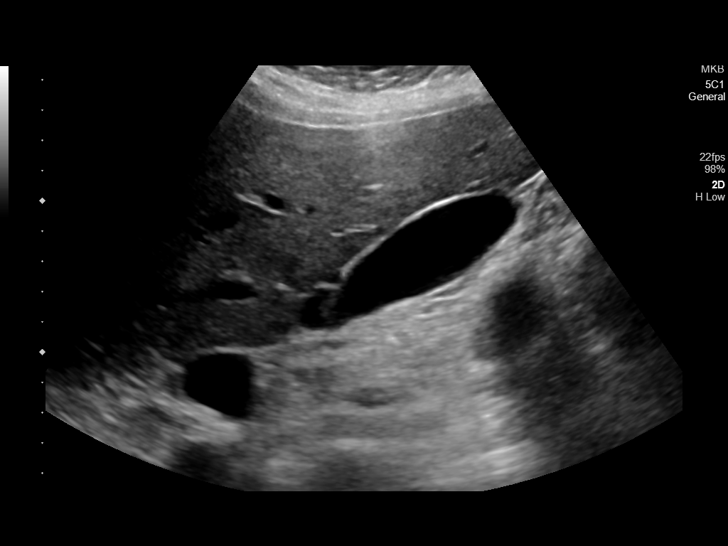
[im 17/45]
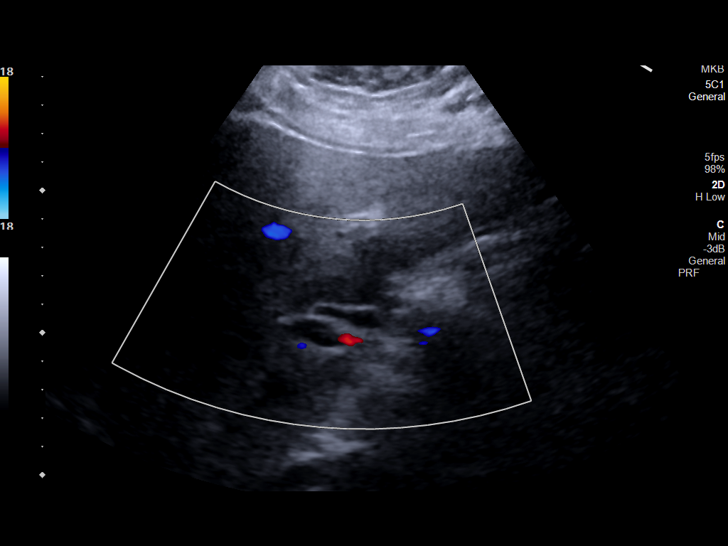
[im 21/45]
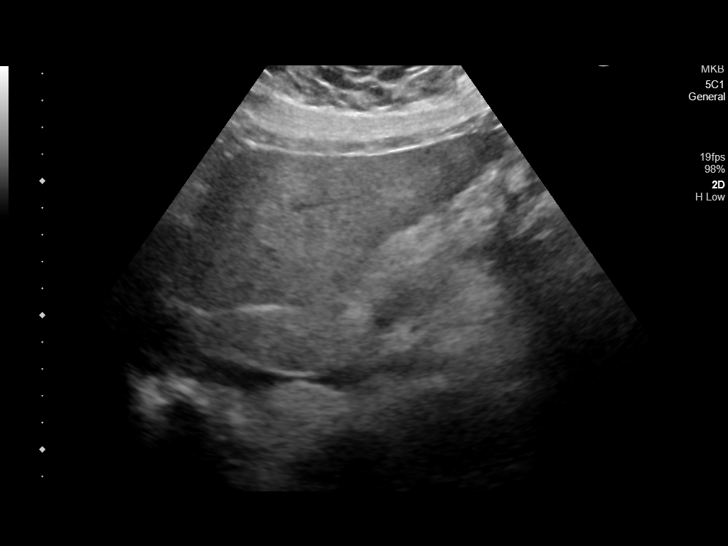
[im 24/45]
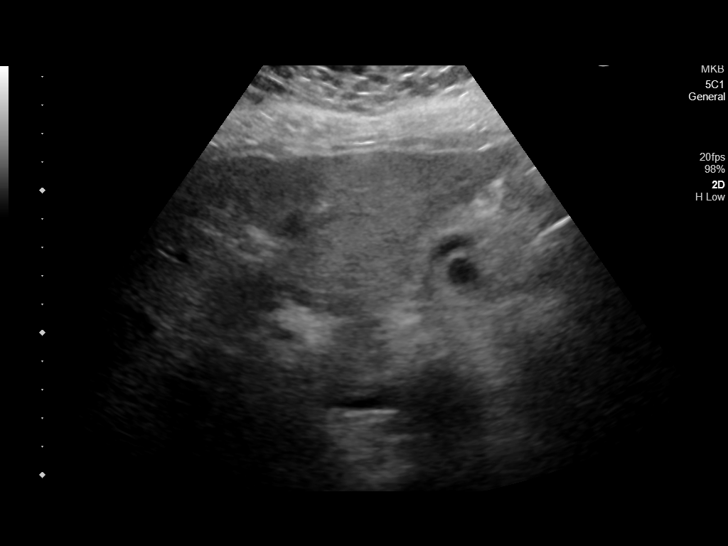
[im 28/45]
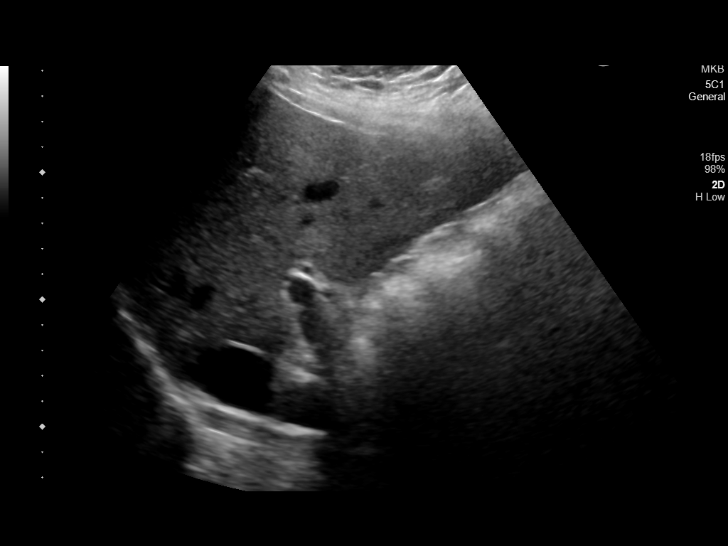
[im 30/45]
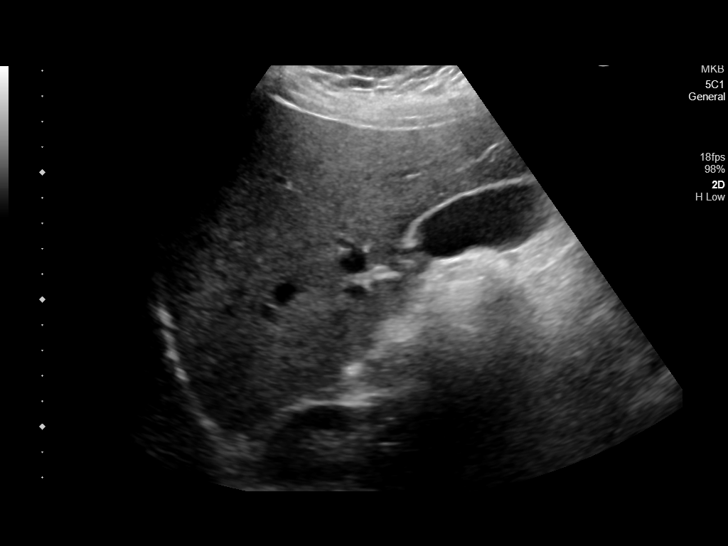
[im 34/45]
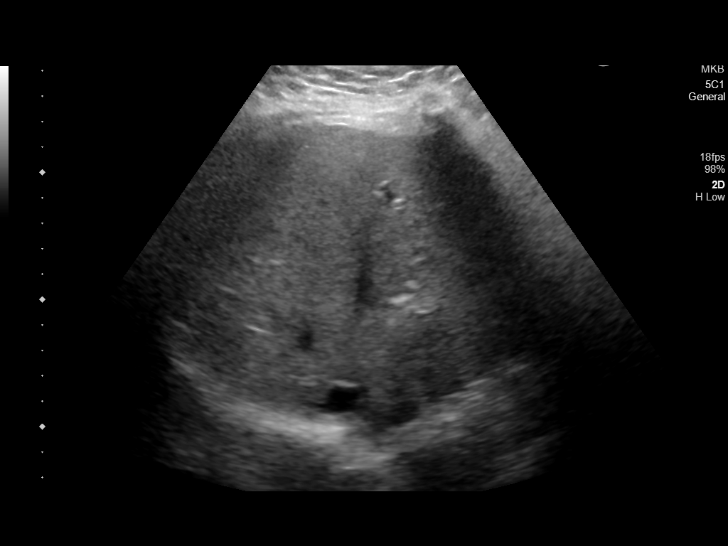
[im 37/45]
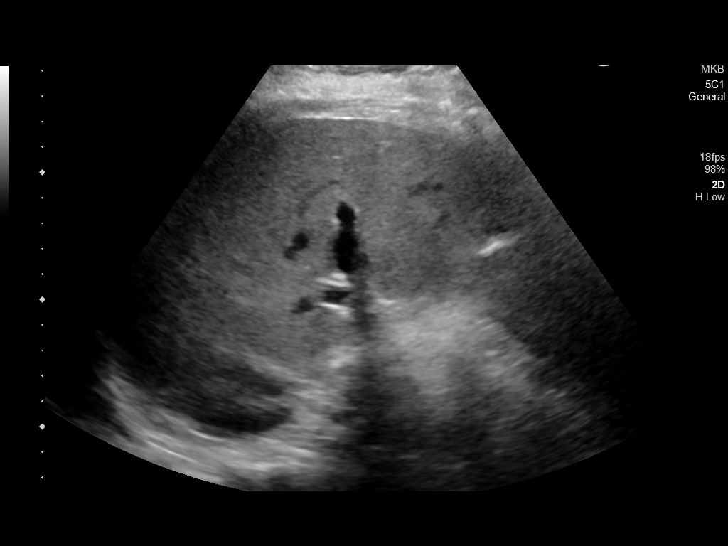
[im 41/45]
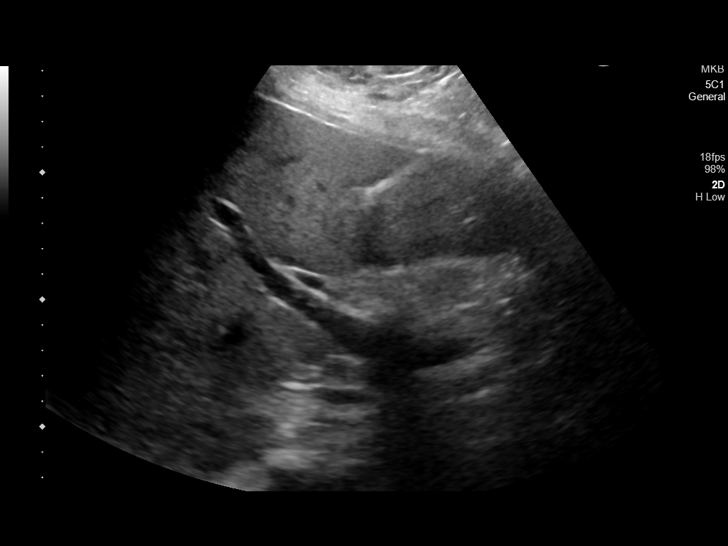
[im 45/45]
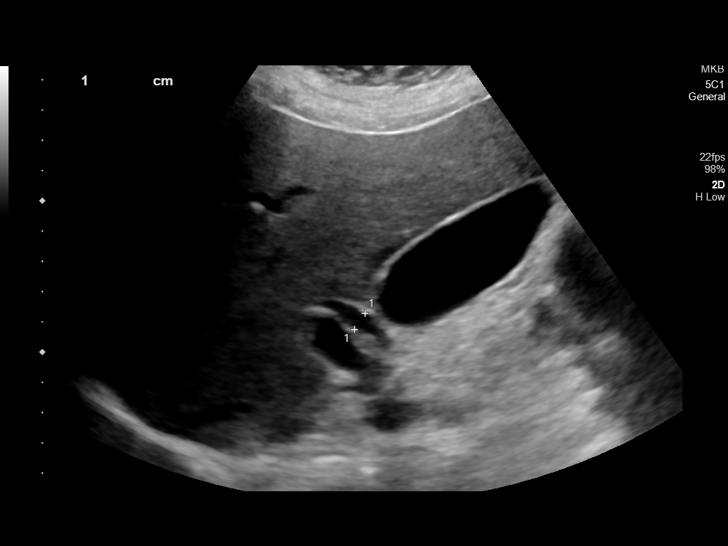

[14 of 25 positions shown; findings below may reference images not displayed]

FINDINGS: Gallbladder:

No gallstones or wall thickening visualized. No sonographic Murphy
sign noted by sonographer.

Common bile duct:

Diameter: 6.5 mm

Liver:

Increased echogenicity with no focal mass. Portal vein is patent on
color Doppler imaging with normal direction of blood flow towards
the liver.

Other: None.
IMPRESSION: 1. The common bile duct is minimally dilated measuring 6.5 mm. 6 mm
is the upper limits of normal. Recommend correlation with labs. If
there is concern for obstruction, recommend MRCP or ERCP.
2. Increased echogenicity in the liver is nonspecific but often seen
with hepatic steatosis.
